# Patient Record
Sex: Female | Born: 1955 | Race: White | Hispanic: No | Marital: Married | Smoking: Never smoker
Health system: Southern US, Community
[De-identification: ages and names within clinical notes are randomized; demographics above are authoritative.]

## PROBLEM LIST (undated history)

## (undated) ENCOUNTER — Ambulatory Visit (HOSPITAL_COMMUNITY): Source: Home / Self Care

## (undated) DIAGNOSIS — Z8371 Family history of colonic polyps: Secondary | ICD-10-CM

## (undated) DIAGNOSIS — R339 Retention of urine, unspecified: Secondary | ICD-10-CM

## (undated) DIAGNOSIS — E119 Type 2 diabetes mellitus without complications: Secondary | ICD-10-CM

## (undated) DIAGNOSIS — G629 Polyneuropathy, unspecified: Secondary | ICD-10-CM

## (undated) DIAGNOSIS — M8580 Other specified disorders of bone density and structure, unspecified site: Secondary | ICD-10-CM

## (undated) DIAGNOSIS — N393 Stress incontinence (female) (male): Secondary | ICD-10-CM

## (undated) DIAGNOSIS — T7840XA Allergy, unspecified, initial encounter: Secondary | ICD-10-CM

## (undated) DIAGNOSIS — L409 Psoriasis, unspecified: Secondary | ICD-10-CM

## (undated) DIAGNOSIS — E782 Mixed hyperlipidemia: Secondary | ICD-10-CM

## (undated) DIAGNOSIS — E785 Hyperlipidemia, unspecified: Secondary | ICD-10-CM

## (undated) DIAGNOSIS — K219 Gastro-esophageal reflux disease without esophagitis: Secondary | ICD-10-CM

## (undated) DIAGNOSIS — R9389 Abnormal findings on diagnostic imaging of other specified body structures: Secondary | ICD-10-CM

## (undated) DIAGNOSIS — J302 Other seasonal allergic rhinitis: Secondary | ICD-10-CM

## (undated) DIAGNOSIS — M1990 Unspecified osteoarthritis, unspecified site: Secondary | ICD-10-CM

## (undated) DIAGNOSIS — N819 Female genital prolapse, unspecified: Secondary | ICD-10-CM

## (undated) DIAGNOSIS — G709 Myoneural disorder, unspecified: Secondary | ICD-10-CM

## (undated) DIAGNOSIS — N182 Chronic kidney disease, stage 2 (mild): Secondary | ICD-10-CM

## (undated) DIAGNOSIS — Z83719 Family history of colon polyps, unspecified: Secondary | ICD-10-CM

## (undated) DIAGNOSIS — Z973 Presence of spectacles and contact lenses: Secondary | ICD-10-CM

## (undated) HISTORY — DX: Family history of colon polyps, unspecified: Z83.719

## (undated) HISTORY — DX: Type 2 diabetes mellitus without complications: E11.9

## (undated) HISTORY — DX: Allergy, unspecified, initial encounter: T78.40XA

## (undated) HISTORY — DX: Gastro-esophageal reflux disease without esophagitis: K21.9

## (undated) HISTORY — DX: Hyperlipidemia, unspecified: E78.5

## (undated) HISTORY — PX: KNEE SURGERY: SHX244

## (undated) HISTORY — DX: Unspecified osteoarthritis, unspecified site: M19.90

## (undated) HISTORY — DX: Myoneural disorder, unspecified: G70.9

## (undated) HISTORY — DX: Family history of colonic polyps: Z83.71

---

## 2002-08-27 ENCOUNTER — Other Ambulatory Visit: Admission: RE | Admit: 2002-08-27 | Discharge: 2002-08-27 | Payer: Self-pay | Admitting: Family Medicine

## 2004-10-06 ENCOUNTER — Other Ambulatory Visit: Admission: RE | Admit: 2004-10-06 | Discharge: 2004-10-06 | Payer: Self-pay | Admitting: Family Medicine

## 2006-03-20 ENCOUNTER — Other Ambulatory Visit: Admission: RE | Admit: 2006-03-20 | Discharge: 2006-03-20 | Payer: Self-pay | Admitting: Family Medicine

## 2007-11-11 ENCOUNTER — Other Ambulatory Visit: Admission: RE | Admit: 2007-11-11 | Discharge: 2007-11-11 | Payer: Self-pay | Admitting: Family Medicine

## 2010-09-16 ENCOUNTER — Other Ambulatory Visit (HOSPITAL_COMMUNITY)
Admission: RE | Admit: 2010-09-16 | Discharge: 2010-09-16 | Disposition: A | Discharge status: Home / Self Care | Payer: Managed Care, Other (non HMO) | Source: Ambulatory Visit | Attending: Family Medicine | Admitting: Family Medicine

## 2010-09-16 ENCOUNTER — Other Ambulatory Visit: Payer: Self-pay | Admitting: Family Medicine

## 2010-09-16 DIAGNOSIS — Z124 Encounter for screening for malignant neoplasm of cervix: Secondary | ICD-10-CM | POA: Insufficient documentation

## 2013-09-18 ENCOUNTER — Other Ambulatory Visit: Payer: Self-pay | Admitting: Family Medicine

## 2013-09-18 ENCOUNTER — Other Ambulatory Visit (HOSPITAL_COMMUNITY)
Admission: RE | Admit: 2013-09-18 | Discharge: 2013-09-18 | Disposition: A | Discharge status: Home / Self Care | Payer: Managed Care, Other (non HMO) | Source: Ambulatory Visit | Attending: Family Medicine | Admitting: Family Medicine

## 2013-09-18 DIAGNOSIS — Z124 Encounter for screening for malignant neoplasm of cervix: Secondary | ICD-10-CM | POA: Insufficient documentation

## 2013-09-22 LAB — CYTOLOGY - PAP

## 2013-11-18 ENCOUNTER — Ambulatory Visit (INDEPENDENT_AMBULATORY_CARE_PROVIDER_SITE_OTHER): Payer: Managed Care, Other (non HMO) | Admitting: Sports Medicine

## 2013-11-18 ENCOUNTER — Encounter: Payer: Self-pay | Admitting: Sports Medicine

## 2013-11-18 ENCOUNTER — Ambulatory Visit: Payer: Managed Care, Other (non HMO) | Admitting: Sports Medicine

## 2013-11-18 VITALS — BP 135/86 | HR 90 | Ht 63.0 in | Wt 135.0 lb

## 2013-11-18 DIAGNOSIS — M25562 Pain in left knee: Secondary | ICD-10-CM

## 2013-11-18 DIAGNOSIS — M1711 Unilateral primary osteoarthritis, right knee: Secondary | ICD-10-CM | POA: Insufficient documentation

## 2013-11-18 NOTE — Patient Instructions (Addendum)
Work with a Pro so that you are hitting your back hand off your front (right) leg instead of your back leg.   Hit on your own with one leg backhands off the wall standing on your right leg.  - 10-15 minutes.   Three times a week, stationary bike. Rehab for the knee.   Try to perform the exercises that we gave you at least once a day.   Do the drills and exercises for three weeks. The fourth week you can start to play socially.   Follow up in 4 weeks.

## 2013-11-18 NOTE — Progress Notes (Signed)
   Subjective:    Patient ID: Susan Stafford, female    DOB: 12/19/1955, 58 y.o.   MRN: [id number]  HPI  Shayleigh Bjorkman is here for left knee pain.   Patient is an avid Firefighter. She was hitting a back hand a year ago and felt pain in her left knee. She had an MRI showing a posterior lateral partial meniscus tear. She was seen by an orthopedic surgeon that encouraged to continue to play and not going to surgery. She was playing about a month ago and was again hitting a backhand and the pain in her left knee became worse. She has not played any tennis since then.  The pain radiates down her posterior calf. It is worse with squatting. Her knee doesn't pop or lock. He knee feels like it might give way.  The pain has stayed the same since the injury about a month ago. She denies any prior surgery in her left knee. She denies any numbness or tingling in her lower extremities.   Review of Systems 11 point review system reviewed and negative other than stated in HPI     Objective:   Physical Exam BP 135/86  Pulse 90  Ht 5\' 3"  (1.6 m)  Wt 135 lb (61.236 kg)  BMI 23.92 kg/m2 General: NAD, well appearing, female  Knee Exam:  Laterality: left Appearance: no overlying effusion, erythema, or ecchymosis  Edema: no posterior, lateral  Tenderness: yes  lateral, meniscal joint line  Range of Motion: Passive Extension: normal Flexion:pain with full flexion Active Extension: normal Flexion: normal Laxity: none Maneuvers: Lachman's: negative  McMurray's: negative  Posterior Drawer: negative  Patellar Compression: negative  Thessaly test: negative  Strength:  Quadricep: 5/5 Hamstring: 5/5 Gait: normal Crepitus felt b/l knee.  HIP exam: moderately weak hip abduction b/l      Assessment & Plan:

## 2013-11-18 NOTE — Assessment & Plan Note (Signed)
Posterior lateral partial meniscal tear upon review of her MRI from one year ago. Most likely worsening of her tear while playing tennis. She wants to continue to play tennis and be active. No effusion today and ligaments intact.  - body helix provided  - given home modalities.   - b/l hip abduction strengthening exercises   - core plank exercises   - step up exercises  - working with a pro to hit back hand off right leg instead of left  - performing these technique changes and exercises for three weeks before playing again  - will play socially after three weeks - follow up in 4 weeks-->consider U/S if any effusion or no improvement.

## 2013-12-16 ENCOUNTER — Ambulatory Visit (INDEPENDENT_AMBULATORY_CARE_PROVIDER_SITE_OTHER): Payer: Managed Care, Other (non HMO) | Admitting: Sports Medicine

## 2013-12-16 ENCOUNTER — Encounter: Payer: Self-pay | Admitting: Sports Medicine

## 2013-12-16 VITALS — BP 138/85

## 2013-12-16 DIAGNOSIS — E119 Type 2 diabetes mellitus without complications: Secondary | ICD-10-CM

## 2013-12-16 DIAGNOSIS — M25562 Pain in left knee: Secondary | ICD-10-CM

## 2013-12-16 NOTE — Assessment & Plan Note (Signed)
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

## 2013-12-16 NOTE — Progress Notes (Signed)
Patient ID: Susan Stafford, female   DOB: [birth year], 58 y.o.   MRN: [id number]  This patient returns for followup of left knee pain She plays regular 2 months She works out regularly One year ago she had evaluation for her knee pain and was found to have a tear in the posterior horn of the lateral meniscus of the left knee  Saw her one month ago I gave her a compression sleeve which does help with walking and tennis I gave her home exercises and some to do with her trainer She states that the knee pain has not changed that much  In questioning we found that she is doing and some deeper squats and lunges which may be aggravating the knee  She has a history of type 2 diabetes Both parents have diabetes Her father died at age 26 with complications related to his heart probably from his diabetes  Physical examination No acute distress and she appears physically fit BP 138/85 mmHg  Left Knee: Normal to inspection with no erythema or effusion or obvious bony abnormalities. Palpation normal with no warmth or joint line tenderness or patellar tenderness or condyle tenderness. ROM normal in flexion and extension and lower leg rotation. Ligaments with solid consistent endpoints including ACL, PCL, LCL, MCL. Negative Mcmurray's and provocative meniscal tests. Non painful patellar compression. Patellar and quadriceps tendons unremarkable. Hamstring and quadriceps strength is normal.  However compared to the right knee the left knee does lack 5-10 of full flexion  She does have excellent hip abduction strength and hip flexion strength  MSK ultrasound Left knee shows a small suprapatellar pouch effusion that is not present on the right This extends approximately 3 cm above the patella Quadriceps and patellar tendons are normal Medial meniscus normal Lateral meniscus is normal with the exception of the posterior horn which shows a small split

## 2014-06-05 ENCOUNTER — Other Ambulatory Visit (HOSPITAL_COMMUNITY)
Admission: RE | Admit: 2014-06-05 | Discharge: 2014-06-05 | Disposition: A | Discharge status: Home / Self Care | Payer: Managed Care, Other (non HMO) | Source: Ambulatory Visit | Attending: Obstetrics and Gynecology | Admitting: Obstetrics and Gynecology

## 2014-06-05 ENCOUNTER — Other Ambulatory Visit: Payer: Self-pay | Admitting: Obstetrics and Gynecology

## 2014-06-05 DIAGNOSIS — Z1151 Encounter for screening for human papillomavirus (HPV): Secondary | ICD-10-CM | POA: Insufficient documentation

## 2014-06-08 LAB — CERVICOVAGINAL ANCILLARY ONLY: HPV: NOT DETECTED

## 2014-09-21 ENCOUNTER — Other Ambulatory Visit: Payer: Self-pay

## 2014-09-21 DIAGNOSIS — Z1231 Encounter for screening mammogram for malignant neoplasm of breast: Secondary | ICD-10-CM

## 2014-10-05 ENCOUNTER — Ambulatory Visit
Admission: RE | Admit: 2014-10-05 | Discharge: 2014-10-05 | Disposition: A | Discharge status: Home / Self Care | Payer: Managed Care, Other (non HMO) | Source: Ambulatory Visit

## 2014-10-05 DIAGNOSIS — Z1231 Encounter for screening mammogram for malignant neoplasm of breast: Secondary | ICD-10-CM

## 2015-01-28 ENCOUNTER — Ambulatory Visit
Admission: RE | Admit: 2015-01-28 | Discharge: 2015-01-28 | Disposition: A | Discharge status: Home / Self Care | Payer: Managed Care, Other (non HMO) | Source: Ambulatory Visit | Attending: Family Medicine | Admitting: Family Medicine

## 2015-01-28 ENCOUNTER — Other Ambulatory Visit: Payer: Self-pay | Admitting: Family Medicine

## 2015-01-28 DIAGNOSIS — M79601 Pain in right arm: Secondary | ICD-10-CM

## 2015-01-28 DIAGNOSIS — M79602 Pain in left arm: Principal | ICD-10-CM

## 2015-01-28 DIAGNOSIS — M542 Cervicalgia: Secondary | ICD-10-CM

## 2015-03-11 ENCOUNTER — Ambulatory Visit (INDEPENDENT_AMBULATORY_CARE_PROVIDER_SITE_OTHER): Payer: Managed Care, Other (non HMO) | Admitting: Neurology

## 2015-03-11 ENCOUNTER — Ambulatory Visit (INDEPENDENT_AMBULATORY_CARE_PROVIDER_SITE_OTHER): Payer: Self-pay | Admitting: Neurology

## 2015-03-11 DIAGNOSIS — Z0289 Encounter for other administrative examinations: Secondary | ICD-10-CM

## 2015-03-11 DIAGNOSIS — R252 Cramp and spasm: Secondary | ICD-10-CM | POA: Diagnosis not present

## 2015-03-11 NOTE — Procedures (Signed)
  [facility]    Provider:  Dr Jaynee Eagles Referring Provider: Kelton Pillar, MD Primary Care Physician:  Osborne Casco, MD  History:  Susan Stafford is a 60 y.o. female here as a referral from Dr. Laurann Montana for spasms in her upper extremities for several months. Denies pain, paresthesias or numbness.  Summary  Nerve conduction studies were performed on the bilateral upper extremities:  The bilateral Median motor nerves showed normal conductions with normal F Wave latencies The bilateral Ulnar motor nerves showed normal conductions with normal F Wave latencies The bilateral second-digit Median sensory nerves were within normal limits The bilateral fifth-digit Ulnar sensory nerves were within normal limits  EMG Needle study was performed on selected right upper extremity muscles:   The right Deltoid, right Triceps, right Pronator Teres, right Opponens Pollicis, right First Dorsal interosseous muscles were within normal limits.   Conclusion: This is a normal study. No electrophysiologic evidence for ulnar or median neuropathy, peripheral polyneuropathy, cervical radiculopathy or muscle disorder.   Sarina Ill, MD  [facility] [address] [place], [place] [address]  Phone [phone] Fax [phone]

## 2015-03-11 NOTE — Progress Notes (Signed)
  [facility]    Provider:  Dr Jaynee Eagles Referring Provider: Kelton Pillar, MD Primary Care Physician:  Osborne Casco, MD  History:  Susan Stafford is a 60 y.o. female here as a referral from Dr. Laurann Montana for spasms in her upper extremities for several months. Denies pain, paresthesias or numbness.  Summary  Nerve conduction studies were performed on the bilateral upper extremities:  The bilateral Median motor nerves showed normal conductions with normal F Wave latencies The bilateral Ulnar motor nerves showed normal conductions with normal F Wave latencies The bilateral second-digit Median sensory nerves were within normal limits The bilateral fifth-digit Ulnar sensory nerves were within normal limits  EMG Needle study was performed on selected right upper extremity muscles:   The right Deltoid, right Triceps, right Pronator Teres, right Opponens Pollicis, right First Dorsal interosseous muscles were within normal limits.   Conclusion: This is a normal study. No electrophysiologic evidence for ulnar or median neuropathy, peripheral polyneuropathy, cervical radiculopathy or muscle disorder.   Sarina Ill, MD  [facility] [address] [place], [place] [address]  Phone [phone] Fax [phone]

## 2015-03-14 NOTE — Progress Notes (Signed)
See procedure note.

## 2015-05-31 ENCOUNTER — Other Ambulatory Visit: Payer: Self-pay | Admitting: Obstetrics and Gynecology

## 2015-05-31 ENCOUNTER — Other Ambulatory Visit (HOSPITAL_COMMUNITY)
Admission: RE | Admit: 2015-05-31 | Discharge: 2015-05-31 | Disposition: A | Discharge status: Home / Self Care | Payer: Managed Care, Other (non HMO) | Source: Ambulatory Visit | Attending: Obstetrics and Gynecology | Admitting: Obstetrics and Gynecology

## 2015-05-31 DIAGNOSIS — Z01419 Encounter for gynecological examination (general) (routine) without abnormal findings: Secondary | ICD-10-CM | POA: Insufficient documentation

## 2015-06-01 LAB — CYTOLOGY - PAP

## 2015-09-09 ENCOUNTER — Other Ambulatory Visit: Payer: Self-pay | Admitting: Family Medicine

## 2015-09-09 DIAGNOSIS — Z1231 Encounter for screening mammogram for malignant neoplasm of breast: Secondary | ICD-10-CM

## 2015-10-06 ENCOUNTER — Ambulatory Visit
Admission: RE | Admit: 2015-10-06 | Discharge: 2015-10-06 | Disposition: A | Discharge status: Home / Self Care | Payer: Managed Care, Other (non HMO) | Source: Ambulatory Visit | Attending: Family Medicine | Admitting: Family Medicine

## 2015-10-06 DIAGNOSIS — Z1231 Encounter for screening mammogram for malignant neoplasm of breast: Secondary | ICD-10-CM

## 2015-10-07 ENCOUNTER — Other Ambulatory Visit: Payer: Self-pay | Admitting: Family Medicine

## 2015-10-07 DIAGNOSIS — M5430 Sciatica, unspecified side: Secondary | ICD-10-CM

## 2015-10-15 ENCOUNTER — Other Ambulatory Visit: Payer: Self-pay | Admitting: Family Medicine

## 2015-10-15 DIAGNOSIS — M5430 Sciatica, unspecified side: Secondary | ICD-10-CM

## 2015-10-23 ENCOUNTER — Ambulatory Visit
Admission: RE | Admit: 2015-10-23 | Discharge: 2015-10-23 | Disposition: A | Discharge status: Home / Self Care | Payer: Managed Care, Other (non HMO) | Source: Ambulatory Visit | Attending: Family Medicine | Admitting: Family Medicine

## 2015-10-23 ENCOUNTER — Other Ambulatory Visit: Payer: Managed Care, Other (non HMO)

## 2015-10-23 DIAGNOSIS — M5430 Sciatica, unspecified side: Secondary | ICD-10-CM

## 2018-01-31 HISTORY — PX: KNEE ARTHROSCOPY: SUR90

## 2018-07-23 HISTORY — PX: KNEE ARTHROSCOPY W/ MENISCAL REPAIR: SHX1877

## 2018-12-03 ENCOUNTER — Other Ambulatory Visit: Payer: Self-pay

## 2018-12-03 ENCOUNTER — Encounter: Payer: Self-pay | Admitting: Sports Medicine

## 2018-12-03 ENCOUNTER — Ambulatory Visit (INDEPENDENT_AMBULATORY_CARE_PROVIDER_SITE_OTHER): Payer: 59 | Admitting: Sports Medicine

## 2018-12-03 VITALS — BP 112/86 | Ht 63.0 in | Wt 130.0 lb

## 2018-12-03 DIAGNOSIS — M25561 Pain in right knee: Secondary | ICD-10-CM | POA: Diagnosis not present

## 2018-12-03 MED ORDER — METHYLPREDNISOLONE ACETATE 40 MG/ML IJ SUSP
40.0000 mg | Freq: Once | INTRAMUSCULAR | Status: AC
  Administered 2018-12-03: 40 mg via INTRA_ARTICULAR

## 2018-12-03 NOTE — Progress Notes (Signed)
PCP: Kelton Pillar, MD  Subjective:   HPI: Patient is a 63 y.o. female here for evaluation of right knee pain.  Patient notes her knee pain is been present for the last several weeks.  In June she had a arthroscopy for partial meniscectomy of her right knee.  She notes her pain improved completely after the surgery however several weeks ago it returned.  The pain is located on the medial aspect of her knee.  It does not radiate.  She denies any locking or catching of her knee but does note some instability at times.  Patient does endorse swelling of her knee but she has no bruising.  She has not taken anything for her pain other than over-the-counter anti-inflammatories which has not helped improve her pain.  She notes she did not do any sort of rehab after her surgery.  Patient notes the pain has a sharp stabbing quality to it.  Pain is very limiting and keeps her up at night. She would like CSI.                                                                                    Review of Systems: See HPI above.  History reviewed. No pertinent past medical history.  Current Outpatient Medications on File Prior to Visit  Medication Sig Dispense Refill  . aspirin 81 MG tablet Take 81 mg by mouth daily.    . Dulaglutide (TRULICITY) 1.5 0000000 SOPN Inject 1.5 mg into the skin.    . Empagliflozin-metFORMIN HCl ER (SYNJARDY XR) 12.06-998 MG TB24 Synjardy XR 12.5 mg-1,000 mg tablet, extended release    . Multiple Vitamin (MULTIVITAMIN) capsule Take 1 capsule by mouth daily.    Marland Kitchen estrogen, conjugated,-medroxyprogesterone (PREMPRO) 0.45-1.5 MG per tablet Take 1 tablet by mouth daily.    . fluvastatin (LESCOL) 40 MG capsule Take 40 mg by mouth at bedtime.    Marland Kitchen linagliptin (TRADJENTA) 5 MG TABS tablet Take 5 mg by mouth daily.    . meloxicam (MOBIC) 7.5 MG tablet Take 7.5 mg by mouth daily.    . metFORMIN (GLUCOPHAGE) 500 MG tablet Take by mouth 2 (two) times daily with a meal.     No current  facility-administered medications on file prior to visit.     History reviewed. No pertinent surgical history.  No Known Allergies  Social History   Socioeconomic History  . Marital status: Married    Spouse name: Not on file  . Number of children: Not on file  . Years of education: Not on file  . Highest education level: Not on file  Occupational History  . Not on file  Social Needs  . Financial resource strain: Not on file  . Food insecurity    Worry: Not on file    Inability: Not on file  . Transportation needs    Medical: Not on file    Non-medical: Not on file  Tobacco Use  . Smoking status: Never Smoker  Substance and Sexual Activity  . Alcohol use: Not on file  . Drug use: Not on file  . Sexual activity: Not on file  Lifestyle  . Physical activity  Days per week: Not on file    Minutes per session: Not on file  . Stress: Not on file  Relationships  . Social Herbalist on phone: Not on file    Gets together: Not on file    Attends religious service: Not on file    Active member of club or organization: Not on file    Attends meetings of clubs or organizations: Not on file    Relationship status: Not on file  . Intimate partner violence    Fear of current or ex partner: Not on file    Emotionally abused: Not on file    Physically abused: Not on file    Forced sexual activity: Not on file  Other Topics Concern  . Not on file  Social History Narrative  . Not on file    History reviewed. No pertinent family history.      Objective:  Physical Exam: BP 112/86   Ht 5\' 3"  (1.6 m)   Wt 130 lb (59 kg)   BMI 23.03 kg/m  Gen: NAD, comfortable in exam room Lungs: Breathing comfortably on room air Knee Exam Right -Inspection: Diffuse swelling noted of the right knee.  There is no discoloration, no deformity -Palpation: Tenderness palpation at the right medial joint line. -ROM: Extension: 0 degrees; Flexion: 140 degrees -Strength: Extension:  5/5 however atrophy of the VMO was seen while testing strength knee extension; Flexion: 5/5.  Patient with 4 out of 5 strength with hip abduction. -Special Tests: Varus Stress: Negative; Valgus Stress: Negative; Lachman: Negative; Posterior drawer: Negative; McMurray: Negative -Limb neurovascularly intact, no instability noted  Contralateral Knee -Inspection: no deformity, no discoloration -Palpation: medial joint line: Non-tender; lateral joint line: non-tender; quad tendon: non-tender; patella: non-tender; patellar tendon: non-tendon -ROM: Extension: 0 degrees; Flexion: 140 degrees -Strength: Extension: 5/5 however atrophy of the VMO was noted while testing strength of knee extension; Flexion: 5/5.  4 out of 5 strength with hip abduction -Limb neurovascularly intact, no instability noted   Limited diagnostic ultrasound of the right knee Findings: -Patient with a large joint effusion noted in the suprapatellar pouch is seen in both long and short axis -Normal appearance of the quadriceps tendon -Normal appearance of the patellar tendon -Overall normal appearance of the medial meniscus however there was a small section of meniscus that was removed from her surgery. -Normal appearance of the lateral meniscus Impression: -Postoperative findings from a partial meniscectomy as well as a large joint effusion  Ultrasound was done with Dr. Oneida Alar Agree with findings.  Ila Mcgill, MD   Assessment & Plan:  Patient is a 64 y.o. female here for evaluation of right knee pain  1.  Right knee pain -Patient with large joint effusion of her right knee and history of knee arthroscopy for partial meniscectomy and June -Patient with significant weakness noted in the hips seen on physical exam -Patient given home exercises to work on including hip abduction, hip external rotation, sidestep, crossover step up, knee extensions while holding a ball between the legs -Patient was fitted for knee compression  sleeve  -Consent was obtained for aspiration and injection of patient's right knee.  The skin was cleaned and prepped using alcohol.  Ultrasound guidance was used to aspirate her right knee draining approximately 10 cc of clear straw-colored fluid.  Next 40 mg of Depo-Medrol and 3 cc of lidocaine were injected into her knee.  Patient tolerated the injection well there were no complications.  Patient will  follow-up in 4 to 6 weeks  I observed and examined the patient with Dr. Sheppard Coil and agree with assessment and plan.  Note reviewed and modified by me. Ila Mcgill, MD

## 2018-12-03 NOTE — Patient Instructions (Signed)
For your knee pain I would like you to work on the following exercises  1.  Hip abduction 2.  Hip external rotation 3.  Sidestep 4.  Crossover step up 5.  Knee extensions while holding a ball between the legs  The injection you received will take up to a week to work.  Do not be alarmed if you do not have pain relief within the next few days.  Tonight you may ice your knee if you have pain.  We will fit you for a knee compression sleeve for the right leg  We will see you back in 4 to 6 weeks

## 2018-12-18 ENCOUNTER — Other Ambulatory Visit: Payer: Self-pay | Admitting: Family Medicine

## 2018-12-18 DIAGNOSIS — Z1231 Encounter for screening mammogram for malignant neoplasm of breast: Secondary | ICD-10-CM

## 2018-12-19 ENCOUNTER — Ambulatory Visit
Admission: RE | Admit: 2018-12-19 | Discharge: 2018-12-19 | Disposition: A | Discharge status: Home / Self Care | Payer: 59 | Source: Ambulatory Visit | Attending: Family Medicine | Admitting: Family Medicine

## 2018-12-19 ENCOUNTER — Other Ambulatory Visit: Payer: Self-pay

## 2018-12-19 DIAGNOSIS — Z1231 Encounter for screening mammogram for malignant neoplasm of breast: Secondary | ICD-10-CM

## 2018-12-31 ENCOUNTER — Encounter: Payer: Self-pay | Admitting: Sports Medicine

## 2018-12-31 ENCOUNTER — Other Ambulatory Visit: Payer: Self-pay

## 2018-12-31 ENCOUNTER — Ambulatory Visit (INDEPENDENT_AMBULATORY_CARE_PROVIDER_SITE_OTHER): Payer: 59 | Admitting: Sports Medicine

## 2018-12-31 VITALS — BP 112/78 | Ht 63.0 in | Wt 132.0 lb

## 2018-12-31 DIAGNOSIS — M25561 Pain in right knee: Secondary | ICD-10-CM | POA: Diagnosis not present

## 2018-12-31 NOTE — Progress Notes (Signed)
[facility] [address] [place], [place] [address] Phone: [phone] Fax: [phone]   Patient Name: Susan Stafford Date of Birth: 1955/12/08 Medical Record Number: [id number] Gender: female Date of Encounter: 12/31/2018  CC: Right knee pain  HPI: Susan Stafford is following up for right knee pain.  As a reminder she had a scope in June of this year, but has dealt with right knee pain and swelling over the last 6-8 weeks.  She was last seen 1 month ago at which time she had an aspiration and CS injection.  Unfortunately she only had a couple of days of relief, and the pain and swelling returned.  It is aggravated with walking, standing, and rising from a seated position.  She takes Aleve as needed.  She was using her compression sleeve, but felt too uncomfortable.  Still not able to play tennis or ride a bicycle without being in pain.  No past medical history on file.  Current Outpatient Medications on File Prior to Visit  Medication Sig Dispense Refill  . Dulaglutide (TRULICITY) 1.5 0000000 SOPN Inject 1.5 mg into the skin.    . Empagliflozin-metFORMIN HCl ER (SYNJARDY XR) 12.06-998 MG TB24 Synjardy XR 12.5 mg-1,000 mg tablet, extended release    . fluvastatin (LESCOL) 40 MG capsule Take 40 mg by mouth at bedtime.    . Multiple Vitamin (MULTIVITAMIN) capsule Take 1 capsule by mouth daily.    Marland Kitchen aspirin 81 MG tablet Take 81 mg by mouth daily.    Marland Kitchen estrogen, conjugated,-medroxyprogesterone (PREMPRO) 0.45-1.5 MG per tablet Take 1 tablet by mouth daily.    Marland Kitchen linagliptin (TRADJENTA) 5 MG TABS tablet Take 5 mg by mouth daily.    . meloxicam (MOBIC) 7.5 MG tablet Take 7.5 mg by mouth daily.    . metFORMIN (GLUCOPHAGE) 500 MG tablet Take by mouth 2 (two) times daily with a meal.     No current facility-administered medications on file prior to visit.     No past surgical history on file.  Arthroscopic surgery by Dr. Theda Sers 06/2018 RT Knee medial  meniscus  No Known Allergies  Social History   Socioeconomic History  . Marital status: Married    Spouse name: Not on file  . Number of children: Not on file  . Years of education: Not on file  . Highest education level: Not on file  Occupational History  . Not on file  Social Needs  . Financial resource strain: Not on file  . Food insecurity    Worry: Not on file    Inability: Not on file  . Transportation needs    Medical: Not on file    Non-medical: Not on file  Tobacco Use  . Smoking status: Never Smoker  Substance and Sexual Activity  . Alcohol use: Not on file  . Drug use: Not on file  . Sexual activity: Not on file  Lifestyle  . Physical activity    Days per week: Not on file    Minutes per session: Not on file  . Stress: Not on file  Relationships  . Social Herbalist on phone: Not on file    Gets together: Not on file    Attends religious service: Not on file    Active member of club or organization: Not on file    Attends meetings of clubs or organizations: Not on file    Relationship status: Not on file  . Intimate partner violence  Fear of current or ex partner: Not on file    Emotionally abused: Not on file    Physically abused: Not on file    Forced sexual activity: Not on file  Other Topics Concern  . Not on file  Social History Narrative  . Not on file    No family history on file.  BP 112/78   Ht 5\' 3"  (1.6 m)   Wt 132 lb (59.9 kg)   BMI 23.38 kg/m   ROS:  See HPI CONST: no F/C, no malaise, no fatigue MSK: See above NEURO: no numbness/tingling, no weakness SKIN: no rash, no lesions HEME: no bleeding, no bruising, no erythema  Objective: GEN: Alert and oriented, NAD Pulm: Breathing unlabored PSY: normal mood, congruent affect  Right knee: Moderate prepatellar and suprapatellar effusion. Mild TTP at medial joint line Lacking terminal knee extension Ligaments with solid consistent endpoints including ACL, PCL,  LCL, MCL. Weakly positive Thessaly Non painful patellar compression. Patellar glide without crepitus. Patellar and quadriceps tendons unremarkable. Hamstring and quadriceps strength is normal.  Neurovascularly intact.  She had XR of her right knee at the orthopedic clinic which demonstrated moderate- osteoarthritis. Has also had XR and assessment showing Left knee OA   Assessment and Plan:  1.  Recalcitrant right knee pain  There is concern for a possible OCD lesion giving the recurrence of swelling and pain being the primary symptom. She does have some known OA.  Meniscus surgery did seem to help in the first 3 months.  We will move forward with an MRI of the right knee to further evaluate intra-articular damage.  We will bring her back to clinic to review images and discuss treatment plan.  In the interim recommended wearing the compression sleeve in spurts.  Continue ice and anti-inflammatory.  Continue HEP.   Lanier Clam, DO, ATC Sports Medicine Fellow  I observed and examined the patient with the Dr. Kathrynn Speed and agree with assessment and plan.  Note reviewed and modified by me. Will go over MRI with patient and try to decide if more surgical intervention needed. Ila Mcgill, MD

## 2019-01-23 ENCOUNTER — Other Ambulatory Visit: Payer: Self-pay

## 2019-01-23 ENCOUNTER — Ambulatory Visit
Admission: RE | Admit: 2019-01-23 | Discharge: 2019-01-23 | Disposition: A | Discharge status: Home / Self Care | Payer: 59 | Source: Ambulatory Visit | Attending: Sports Medicine | Admitting: Sports Medicine

## 2019-01-23 DIAGNOSIS — M25561 Pain in right knee: Secondary | ICD-10-CM

## 2019-02-04 ENCOUNTER — Other Ambulatory Visit (HOSPITAL_COMMUNITY)
Admission: RE | Admit: 2019-02-04 | Discharge: 2019-02-04 | Disposition: A | Discharge status: Home / Self Care | Payer: BC Managed Care – PPO | Source: Ambulatory Visit | Attending: Family Medicine | Admitting: Family Medicine

## 2019-02-04 ENCOUNTER — Other Ambulatory Visit: Payer: Self-pay | Admitting: Family Medicine

## 2019-02-04 DIAGNOSIS — Z124 Encounter for screening for malignant neoplasm of cervix: Secondary | ICD-10-CM | POA: Diagnosis present

## 2019-02-11 LAB — CYTOLOGY - PAP
Comment: NEGATIVE
Diagnosis: NEGATIVE
High risk HPV: NEGATIVE

## 2019-08-19 ENCOUNTER — Other Ambulatory Visit: Payer: Self-pay

## 2019-08-19 ENCOUNTER — Telehealth (INDEPENDENT_AMBULATORY_CARE_PROVIDER_SITE_OTHER): Payer: BC Managed Care – PPO | Admitting: Sports Medicine

## 2019-08-19 DIAGNOSIS — M1711 Unilateral primary osteoarthritis, right knee: Secondary | ICD-10-CM | POA: Diagnosis not present

## 2019-08-19 MED ORDER — TRAMADOL HCL 50 MG PO TABS: 50.0000 mg | ORAL_TABLET | Freq: Four times a day (QID) | ORAL | 0 refills | Status: DC | PRN

## 2019-08-19 NOTE — Assessment & Plan Note (Signed)
Hx of mild psoriasis Unsure if this is a cause for accelerated OA of knees Active in walking and tennis before  Her symptoms are quite limiting now I believe she needs an opinion regarding TKR D/W patient and she would like to see Dr. Maureen Ralphs Referral for TKR opinion made today

## 2019-08-19 NOTE — Progress Notes (Addendum)
Patient agrees to a video visit today.  She understands limitations of exam Patient at home Physician in [facility] office  Chief complaint right knee pain  Patient was evaluated last fall in October for acute pain of her right knee She had arthroscopic surgery by Dr. Theda Sers and did get some relief However by October pain had gotten severe again and and stopped her from playing tennis She has documented arthritis in the right and left knee The right knee was the key symptomatic 1 recently  When seen in October by she had a pretty significant knee effusion Aspiration of fluid and corticosteroid injection gave her some brief relief She cut down her walking She takes 2 Aleve most days but is concerned with this because she is diabetic She tried tennis one time but felt that the knee was quite a bit worse after playing and decided not to continue  Today our discussion centers around treatment options  MRI on January 24, 2019 showed degenerative meniscal changes 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 Physician in [facility] office  Ila Mcgill, MD 07/13/20

## 2019-09-04 ENCOUNTER — Other Ambulatory Visit: Payer: Self-pay | Admitting: *Deleted

## 2019-09-04 MED ORDER — TRAMADOL HCL 50 MG PO TABS: 50.0000 mg | ORAL_TABLET | Freq: Four times a day (QID) | ORAL | 0 refills | Status: DC | PRN

## 2019-11-17 ENCOUNTER — Other Ambulatory Visit: Payer: Self-pay | Admitting: Family Medicine

## 2019-11-17 DIAGNOSIS — Z1231 Encounter for screening mammogram for malignant neoplasm of breast: Secondary | ICD-10-CM

## 2019-12-22 ENCOUNTER — Ambulatory Visit
Admission: RE | Admit: 2019-12-22 | Discharge: 2019-12-22 | Disposition: A | Discharge status: Home / Self Care | Payer: 59 | Source: Ambulatory Visit | Attending: Family Medicine | Admitting: Family Medicine

## 2019-12-22 ENCOUNTER — Other Ambulatory Visit: Payer: Self-pay

## 2019-12-22 DIAGNOSIS — Z1231 Encounter for screening mammogram for malignant neoplasm of breast: Secondary | ICD-10-CM

## 2020-07-07 ENCOUNTER — Other Ambulatory Visit: Payer: Self-pay | Admitting: Sports Medicine

## 2020-07-12 ENCOUNTER — Other Ambulatory Visit: Payer: Self-pay | Admitting: Sports Medicine

## 2020-07-15 ENCOUNTER — Other Ambulatory Visit: Payer: Self-pay

## 2020-07-15 ENCOUNTER — Ambulatory Visit: Payer: BC Managed Care – PPO | Admitting: Sports Medicine

## 2020-07-15 VITALS — BP 118/74 | Ht 63.0 in | Wt 129.0 lb

## 2020-07-15 DIAGNOSIS — M1711 Unilateral primary osteoarthritis, right knee: Secondary | ICD-10-CM

## 2020-07-15 MED ORDER — TRAMADOL HCL 50 MG PO TABS: 50.0000 mg | ORAL_TABLET | Freq: Four times a day (QID) | ORAL | 0 refills | Status: DC | PRN

## 2020-07-15 NOTE — Progress Notes (Signed)
Office Visit Note   Patient: Susan Stafford           Date of Birth: 03/24/1955           MRN: [id number] Visit Date: 07/15/2020 Requested by: Kelton Pillar, MD [address] [place],  [place] [address] PCP: Kelton Pillar, MD  Subjective: CC: Follow-up right knee pain  HPI: 65 year old female with past medical history of significant right knee arthritis presenting to clinic for the same.  Patient states her right knee pain has been largely controlled with exercises, as well as daily Aleve.  She also states she will take tramadol when her pain is severe.  She has been participating in water aerobics as well as quadriceps weights in the gym, which she feels offer significant improvement with her knee stability.  She also wears a knee sleeve with walking and says that this has been very helpful.  Patient is here today because she is leaving on Monday for a long vacation to [place].  She is anticipating doing an ample amount of walking and hiking during this trip, and wants to know if there is anything she can do to help make her pain more bearable.  She would like a refill of tramadol if possible. She has previously spoken with a knee replacement surgeon, who agreed that this would be a viable option for her.  She is getting Medicare insurance this fall, and would like to wait until she has a secondary insurance to help cover the cost of replacement.  Ideally, she would like to wait until the start of the new year so she will have more time for convalescence. She is otherwise doing well, with no additional questions or concerns today.              Objective: Vital Signs: BP 118/74   Ht 5\' 3"  (1.6 m)   Wt 129 lb (58.5 kg)   BMI 22.85 kg/m  No flowsheet data found.   No flowsheet data found.  Physical Exam:  General:  Alert and oriented, in no acute distress. Pulm:  Breathing unlabored. Psy:  Normal mood, congruent affect. Skin: Right knee  without bruises, rashes, or erythema. Overlying skin intact.  Right knee exam General: Normal gait  Moderate patellar crepitus bilaterally with knee flexion and extension.  Knee range of motion is preserved bilaterally.  Palpation: there is tenderness palpation over the medial joint line on the right.  No significant tenderness over lateral joint line or with patellar compression.  Supine exam: Small effusion, normal patellar mobility.   Ligamentous Exam:  No pain or laxity with anterior/posterior drawer.  No obvious Sag.  No pain though does have a subtle pseudolaxity with valgus stress across the knee.  Meniscus:  McMurray with no pain or deep clicking.   Strength: Hip flexion (L1), Hip Aduction (L2), Knee Extension (L3) are 5/5 Bilaterally Foot Inversion (L4), Dorsiflexion (L5), and Eversion (S1) 5/5 Bilaterally  Sensation: Intact to light touch medial and lateral aspects of lower extremities, and lateral, dorsal, and medial aspects of foot.    Imaging: None today.  Assessment & Plan: 65 year old female with past medical history of significant knee osteoarthritis who is presenting to clinic for the same.  Symptoms well controlled with daily Aleve as well as tramadol.  -We will refill her tramadol for her upcoming trip.  Encouraged to take this sparingly as needed for severe pain. -Continue with low-dose Aleve, safe NSAID use discussed.  Will avoid escalating to higher NSAIDs due to her type 2 diabetes. -Also due to her diabetes, will hold on steroid injections today. -Continue knee sleeve wear as well as knee exercises to help strengthen and stabilize the knee. -Patient expresses understanding with plan.  She no further questions or concerns today.     Procedures: No procedures performed     I observed and examined the patient with the SMresident and agree with assessment and plan.  Note reviewed and modified by me. Ila Mcgill, MD

## 2020-11-23 ENCOUNTER — Other Ambulatory Visit: Payer: Self-pay | Admitting: Family Medicine

## 2020-11-23 DIAGNOSIS — Z1231 Encounter for screening mammogram for malignant neoplasm of breast: Secondary | ICD-10-CM

## 2020-12-22 ENCOUNTER — Ambulatory Visit
Admission: RE | Admit: 2020-12-22 | Discharge: 2020-12-22 | Disposition: A | Discharge status: Home / Self Care | Payer: Medicare PPO | Source: Ambulatory Visit | Attending: Family Medicine | Admitting: Family Medicine

## 2020-12-22 ENCOUNTER — Other Ambulatory Visit: Payer: Self-pay

## 2020-12-22 DIAGNOSIS — Z1231 Encounter for screening mammogram for malignant neoplasm of breast: Secondary | ICD-10-CM

## 2021-03-09 ENCOUNTER — Other Ambulatory Visit: Payer: Self-pay | Admitting: Family Medicine

## 2021-03-09 DIAGNOSIS — E2839 Other primary ovarian failure: Secondary | ICD-10-CM

## 2021-03-24 ENCOUNTER — Encounter: Payer: Self-pay | Admitting: Endocrinology

## 2021-04-01 ENCOUNTER — Encounter: Payer: Self-pay | Admitting: Gastroenterology

## 2021-04-29 ENCOUNTER — Other Ambulatory Visit: Payer: Self-pay

## 2021-04-29 ENCOUNTER — Ambulatory Visit (AMBULATORY_SURGERY_CENTER): Payer: Medicare PPO | Admitting: *Deleted

## 2021-04-29 VITALS — Ht 63.0 in | Wt 121.0 lb

## 2021-04-29 DIAGNOSIS — Z1211 Encounter for screening for malignant neoplasm of colon: Secondary | ICD-10-CM

## 2021-04-29 NOTE — Progress Notes (Signed)
No egg or soy allergy known to patient  ?No issues known to pt with past sedation with any surgeries or procedures ?Patient denies ever being told they had issues or difficulty with intubation  ?No FH of Malignant Hyperthermia ?Pt is not on diet pills ?Pt is not on  home 02  ?Pt is not on blood thinners  ?Pt denies issues with constipation  ?No A fib or A flutter ? ?Pt is fully vaccinated  for Covid  ? ?Due to the COVID-19 pandemic we are asking patients to follow certain guidelines in PV and the [place]   ?Pt aware of COVID protocols and [facility] guidelines  ? ?PV completed over the phone. Pt verified name, DOB, address and insurance during PV today.  ?Pt emailed instruction packet to [email or web address] ?Pt encouraged to call with questions or issues.  ?If pt has My chart, procedure instructions sent via My Chart  ?Pt states no previous colonoscopy but has had 2 negative cologuard's in the past  ?

## 2021-05-11 ENCOUNTER — Encounter: Payer: Self-pay | Admitting: Gastroenterology

## 2021-05-11 ENCOUNTER — Telehealth: Payer: Self-pay | Admitting: Gastroenterology

## 2021-05-11 DIAGNOSIS — R3915 Urgency of urination: Secondary | ICD-10-CM | POA: Diagnosis not present

## 2021-05-11 NOTE — Telephone Encounter (Signed)
Patient's colon is scheduled for 3/31.  She just left her PCP's office and has a UTI and was prescribed Cipro.  She is questioning whether or not she should proceed with her colonoscopy.  Please call patient and advise.  Thank you. ?

## 2021-05-11 NOTE — Telephone Encounter (Signed)
Spoke with patient. Informed patient that she may still have the colonoscopy being on cipro as long as she does not have a fever when she comes in. Pt denies any fever. Pt verbalizes understanding to proceed with procedure as scheduled.  ?

## 2021-05-13 ENCOUNTER — Encounter: Payer: Self-pay | Admitting: Gastroenterology

## 2021-05-13 ENCOUNTER — Ambulatory Visit (AMBULATORY_SURGERY_CENTER): Payer: Medicare PPO | Admitting: Gastroenterology

## 2021-05-13 VITALS — BP 116/78 | HR 78 | Temp 97.1°F | Resp 12 | Ht 63.0 in | Wt 129.0 lb

## 2021-05-13 DIAGNOSIS — Z8371 Family history of colonic polyps: Secondary | ICD-10-CM

## 2021-05-13 DIAGNOSIS — D124 Benign neoplasm of descending colon: Secondary | ICD-10-CM | POA: Diagnosis not present

## 2021-05-13 DIAGNOSIS — Z1211 Encounter for screening for malignant neoplasm of colon: Secondary | ICD-10-CM

## 2021-05-13 DIAGNOSIS — K635 Polyp of colon: Secondary | ICD-10-CM

## 2021-05-13 DIAGNOSIS — D122 Benign neoplasm of ascending colon: Secondary | ICD-10-CM

## 2021-05-13 HISTORY — PX: COLONOSCOPY WITH PROPOFOL: SHX5780

## 2021-05-13 MED ORDER — SODIUM CHLORIDE 0.9 % IV SOLN: 500.0000 mL | Freq: Once | INTRAVENOUS | Status: DC

## 2021-05-13 NOTE — Progress Notes (Signed)
Sedate, gd SR, tolerated procedure well, VSS, report to RN 

## 2021-05-13 NOTE — Patient Instructions (Signed)
Handouts given for High Fiber Diet, Diverticulosis and polyps.  Continue present medications.  Await pathology results.  ? ?YOU HAD AN ENDOSCOPIC PROCEDURE TODAY AT [facility]:   Refer to the procedure report that was given to you for any specific questions about what was found during the examination.  If the procedure report does not answer your questions, please call your gastroenterologist to clarify.  If you requested that your care partner not be given the details of your procedure findings, then the procedure report has been included in a sealed envelope for you to review at your convenience later. ? ?YOU SHOULD EXPECT: Some feelings of bloating in the abdomen. Passage of more gas than usual.  Walking can help get rid of the air that was put into your GI tract during the procedure and reduce the bloating. If you had a lower endoscopy (such as a colonoscopy or flexible sigmoidoscopy) you may notice spotting of blood in your stool or on the toilet paper. If you underwent a bowel prep for your procedure, you may not have a normal bowel movement for a few days. ? ?Please Note:  You might notice some irritation and congestion in your nose or some drainage.  This is from the oxygen used during your procedure.  There is no need for concern and it should clear up in a day or so. ? ?SYMPTOMS TO REPORT IMMEDIATELY: ? ?Following lower endoscopy (colonoscopy or flexible sigmoidoscopy): ? Excessive amounts of blood in the stool ? Significant tenderness or worsening of abdominal pains ? Swelling of the abdomen that is new, acute ? Fever of 100?F or higher ? ?For urgent or emergent issues, a gastroenterologist can be reached at any hour by calling [phone]. ?Do not use MyChart messaging for urgent concerns.  ? ? ?DIET:  We do recommend a small meal at first, but then you may proceed to your regular diet.  Drink plenty of fluids but you should avoid alcoholic beverages for 24 hours. ? ?ACTIVITY:  You  should plan to take it easy for the rest of today and you should NOT DRIVE or use heavy machinery until tomorrow (because of the sedation medicines used during the test).   ? ?FOLLOW UP: ?Our staff will call the number listed on your records 48-72 hours following your procedure to check on you and address any questions or concerns that you may have regarding the information given to you following your procedure. If we do not reach you, we will leave a message.  We will attempt to reach you two times.  During this call, we will ask if you have developed any symptoms of COVID 19. If you develop any symptoms (ie: fever, flu-like symptoms, shortness of breath, cough etc.) before then, please call [phone].  If you test positive for Covid 19 in the 2 weeks post procedure, please call and report this information to [place].   ? ?If any biopsies were taken you will be contacted by phone or by letter within the next 1-3 weeks.  Please call us at [phone] if you have not heard about the biopsies in 3 weeks.  ? ? ?SIGNATURES/CONFIDENTIALITY: ?You and/or your care partner have signed paperwork which will be entered into your electronic medical record.  These signatures attest to the fact that that the information above on your After Visit Summary has been reviewed and is understood.  Full responsibility of the confidentiality of this discharge information lies with you and/or your care-partner.  ?

## 2021-05-13 NOTE — Progress Notes (Signed)
VS by CW  Pt's states no medical or surgical changes since previsit or office visit.  

## 2021-05-13 NOTE — Progress Notes (Signed)
? ?Referring Provider: Kelton Pillar, MD ?Primary Care Physician:  Kelton Pillar, MD ? ?Indication for Procedure:  Colon cancer screening ? ? ?IMPRESSION:  ?Need for colon cancer screening ?Appropriate candidate for monitored anesthesia care ? ?PLAN: ?Colonoscopy in the [facility] today ? ? ?HPI: Susan Stafford is a 66 y.o. female presents for screening colonoscopy. ? ?No prior colonoscopy or colon cancer screening. ? ?No baseline GI symptoms.  ? ?Sister with colon polyps. No other known family history of colon cancer or polyps. No family history of uterine/endometrial cancer, pancreatic cancer or gastric/stomach cancer. ? ? ?Past Medical History:  ?Diagnosis Date  ? Allergy   ? seasonal  ? Arthritis   ? knees and one hand  ? Diabetes mellitus without complication ([place])   ? FH: colon polyps   ? sister TA +  ? GERD (gastroesophageal reflux disease)   ? Hyperlipidemia   ? Neuromuscular disorder ([place])   ? slight neuropathy feet  ? ? ?Past Surgical History:  ?Procedure Laterality Date  ? KNEE SURGERY Bilateral   ? ? ?Current Outpatient Medications  ?Medication Sig Dispense Refill  ? Black Pepper-Turmeric 3-500 MG CAPS See admin instructions.    ? calcipotriene (DOVONOX) 9.562 % cream 1 application    ? ciprofloxacin (CIPRO) 500 MG tablet 1 tablet    ? fluvastatin (LESCOL) 40 MG capsule Take 40 mg by mouth at bedtime.    ? glucose blood (ONETOUCH VERIO) test strip USE AS DIRECTED TO CHECK BLOOD SUGAR TWICE DAILY    ? glucose blood (ONETOUCH VERIO) test strip USE AS DIRECTED TO CHECK BLOOD SUGAR TWICE DAILY    ? MOUNJARO 7.5 MG/0.5ML Pen Inject into the skin.    ? Multiple Vitamin (MULTIVITAMIN) capsule Take 1 capsule by mouth daily.    ? OneTouch Delica Lancets 13Y MISC See admin instructions.    ? ONETOUCH VERIO test strip SMARTSIG:Via Meter    ? OVER THE COUNTER MEDICATION Magnesium with vitamin D 3 daily    ? SYNJARDY 12.5-500 MG TABS Take by mouth.    ? telmisartan (MICARDIS) 20 MG tablet Take 1 tablet by  mouth daily.    ? vitamin B-12 (CYANOCOBALAMIN) 100 MCG tablet     ? Calcium Carb-Cholecalciferol 600-10 MG-MCG TABS 1 tablet with food (Patient not taking: Reported on 04/29/2021)    ? diclofenac Sodium (VOLTAREN) 1 % GEL See admin instructions.    ? esomeprazole (NEXIUM) 20 MG capsule 1 capsule    ? traMADol (ULTRAM) 50 MG tablet Take 1 tablet (50 mg total) by mouth every 6 (six) hours as needed. 120 tablet 0  ? ?Current Facility-Administered Medications  ?Medication Dose Route Frequency Provider Last Rate Last Admin  ? 0.9 %  sodium chloride infusion  500 mL Intravenous Once Thornton Park, MD      ? ? ?Allergies as of 05/13/2021 - Review Complete 05/13/2021  ?Allergen Reaction Noted  ? Atorvastatin  02/18/2020  ? Celecoxib  05/25/2020  ? Rosuvastatin  02/18/2020  ? Semaglutide Nausea Only and Other (See Comments) 02/18/2020  ? Simvastatin  02/18/2020  ? Sulfamethoxazole-trimethoprim  02/18/2020  ? ? ?Family History  ?Problem Relation Age of Onset  ? Colon polyps Sister   ? Colon cancer Neg Hx   ? Esophageal cancer Neg Hx   ? Rectal cancer Neg Hx   ? Stomach cancer Neg Hx   ? ? ? ?Physical Exam: ?General:   Alert,  well-nourished, pleasant and cooperative in NAD ?Head:  Normocephalic and atraumatic. ?Eyes:  Sclera clear, no icterus.   Conjunctiva pink. ?Mouth:  No deformity or lesions.   ?Neck:  Supple; no masses or thyromegaly. ?Lungs:  Clear throughout to auscultation.   No wheezes. ?Heart:  Regular rate and rhythm; no murmurs. ?Abdomen:  Soft, non-tender, nondistended, normal bowel sounds, no rebound or guarding.  ?Msk:  Symmetrical. No boney deformities ?LAD: No inguinal or umbilical LAD ?Extremities:  No clubbing or edema. ?Neurologic:  Alert and  oriented x4;  grossly nonfocal ?Skin:  No obvious rash or bruise. ?Psych:  Alert and cooperative. Normal mood and affect. ? ? ? ? ?Studies/Results: ?No results found. ? ? ? ?Arial Galligan L. Tarri Glenn, MD, MPH ?05/13/2021, 11:28 AM ? ? ? ?  ?

## 2021-05-13 NOTE — Progress Notes (Signed)
Called to room to assist during endoscopic procedure.  Patient ID and intended procedure confirmed with present staff. Received instructions for my participation in the procedure from the performing physician.  

## 2021-05-13 NOTE — Op Note (Signed)
[facility] ?Patient Name: Susan Stafford ?Procedure Date: 05/13/2021 11:28 AM ?MRN: [id number] ?Endoscopist: Thornton Park MD, MD ?Age: 66 ?Referring MD:  ?Date of Birth: 1955/08/10 ?Gender: Female ?Account #: [id number] ?Procedure:                Colonoscopy ?Indications:              Screening for colorectal malignant neoplasm, This  ?                          is the patient's first colonoscopy ?                          Sister with colon polyps ?Medicines:                Monitored Anesthesia Care ?Procedure:                Pre-Anesthesia Assessment: ?                          - Prior to the procedure, a History and Physical  ?                          was performed, and patient medications and  ?                          allergies were reviewed. The patient's tolerance of  ?                          previous anesthesia was also reviewed. The risks  ?                          and benefits of the procedure and the sedation  ?                          options and risks were discussed with the patient.  ?                          All questions were answered, and informed consent  ?                          was obtained. Prior Anticoagulants: The patient has  ?                          taken no previous anticoagulant or antiplatelet  ?                          agents. ASA Grade Assessment: II - A patient with  ?                          mild systemic disease. After reviewing the risks  ?                          and benefits, the patient was deemed in  ?  satisfactory condition to undergo the procedure. ?                          After obtaining informed consent, the colonoscope  ?                          was passed under direct vision. Throughout the  ?                          procedure, the patient's blood pressure, pulse, and  ?                          oxygen saturations were monitored continuously. The  ?                          Colonoscope was introduced through the  anus and  ?                          advanced to the 3 cm into the ileum. A second  ?                          forward view of the right colon was performed. The  ?                          colonoscopy was performed without difficulty. The  ?                          patient tolerated the procedure well. The quality  ?                          of the bowel preparation was good. The terminal  ?                          ileum, ileocecal valve, appendiceal orifice, and  ?                          rectum were photographed. ?Scope In: 11:35:31 AM ?Scope Out: 11:56:33 AM ?Scope Withdrawal Time: 0 hours 15 minutes 0 seconds  ?Total Procedure Duration: 0 hours 21 minutes 2 seconds  ?Findings:                 The perianal and digital rectal examinations were  ?                          normal. ?                          Many small and large-mouthed diverticula were found  ?                          in the sigmoid colon and descending colon. ?                          Three sessile polyps were found in the descending  ?  colon. The polyps were 2 to 3 mm in size. These  ?                          polyps were removed with a cold snare. Resection  ?                          and retrieval were complete. Estimated blood loss  ?                          was minimal. ?                          Two sessile polyps were found in the ascending  ?                          colon. The polyps were 2 to 4 mm in size. These  ?                          polyps were removed with a cold snare. Resection  ?                          and retrieval were complete. Estimated blood loss  ?                          was minimal. ?                          The exam was otherwise without abnormality on  ?                          direct and retroflexion views. ?Complications:            No immediate complications. ?Estimated Blood Loss:     Estimated blood loss was minimal. ?Impression:               - Diverticulosis in the sigmoid  colon and in the  ?                          descending colon. ?                          - Three 2 to 3 mm polyps in the descending colon,  ?                          removed with a cold snare. Resected and retrieved. ?                          - Two 2 to 4 mm polyps in the ascending colon,  ?                          removed with a cold snare. Resected and retrieved. ?                          - The examination was otherwise normal  on direct  ?                          and retroflexion views. ?Recommendation:           - Patient has a contact number available for  ?                          emergencies. The signs and symptoms of potential  ?                          delayed complications were discussed with the  ?                          patient. Return to normal activities tomorrow.  ?                          Written discharge instructions were provided to the  ?                          patient. ?                          - High fiber diet. ?                          - Continue present medications. ?                          - Await pathology results. ?                          - Repeat colonoscopy date to be determined after  ?                          pending pathology results are reviewed for  ?                          surveillance. Plan colonoscopy in 3 years if at  ?                          least 3 polyps are adenomatous, otherwise repeat  ?                          colonoscopy in 5 years. ?                          - Emerging evidence supports eating a diet of  ?                          fruits, vegetables, grains, calcium, and yogurt  ?                          while reducing red meat and alcohol may reduce the  ?  risk of colon cancer. ?                          - Thank you for allowing me to be involved in your  ?                          colon cancer prevention. ?Thornton Park MD, MD ?05/13/2021 12:01:37 PM ?This report has been signed electronically. ?

## 2021-05-17 ENCOUNTER — Telehealth: Payer: Self-pay | Admitting: *Deleted

## 2021-05-17 ENCOUNTER — Encounter: Payer: Self-pay | Admitting: Gastroenterology

## 2021-05-17 NOTE — Telephone Encounter (Signed)
?  Follow up Call- ? ? ?  05/13/2021  ? 10:45 AM  ?Call back number  ?Post procedure Call Back phone  # [phone]  ?Permission to leave phone message Yes  ?  ? ?Patient questions: ? ?Do you have a fever, pain , or abdominal swelling? No. ?Pain Score  0 * ? ?Have you tolerated food without any problems? Yes.   ? ?Have you been able to return to your normal activities? Yes.   ? ?Do you have any questions about your discharge instructions: ?Diet   No. ?Medications  No. ?Follow up visit  No. ? ?Do you have questions or concerns about your Care? No. ? ?Actions: ?* If pain score is 4 or above: ?No action needed, pain <4. ? ? ?

## 2021-06-14 DIAGNOSIS — L409 Psoriasis, unspecified: Secondary | ICD-10-CM | POA: Diagnosis not present

## 2021-06-14 DIAGNOSIS — E782 Mixed hyperlipidemia: Secondary | ICD-10-CM | POA: Diagnosis not present

## 2021-06-14 DIAGNOSIS — K219 Gastro-esophageal reflux disease without esophagitis: Secondary | ICD-10-CM | POA: Diagnosis not present

## 2021-06-14 DIAGNOSIS — N182 Chronic kidney disease, stage 2 (mild): Secondary | ICD-10-CM | POA: Diagnosis not present

## 2021-06-14 DIAGNOSIS — E1142 Type 2 diabetes mellitus with diabetic polyneuropathy: Secondary | ICD-10-CM | POA: Diagnosis not present

## 2021-06-14 DIAGNOSIS — E1129 Type 2 diabetes mellitus with other diabetic kidney complication: Secondary | ICD-10-CM | POA: Diagnosis not present

## 2021-08-11 ENCOUNTER — Ambulatory Visit
Admission: RE | Admit: 2021-08-11 | Discharge: 2021-08-11 | Disposition: A | Discharge status: Home / Self Care | Payer: Medicare PPO | Source: Ambulatory Visit | Attending: Family Medicine | Admitting: Family Medicine

## 2021-08-11 DIAGNOSIS — E2839 Other primary ovarian failure: Secondary | ICD-10-CM

## 2021-08-11 DIAGNOSIS — M8589 Other specified disorders of bone density and structure, multiple sites: Secondary | ICD-10-CM | POA: Diagnosis not present

## 2021-08-11 DIAGNOSIS — Z780 Asymptomatic menopausal state: Secondary | ICD-10-CM | POA: Diagnosis not present

## 2021-09-06 DIAGNOSIS — N812 Incomplete uterovaginal prolapse: Secondary | ICD-10-CM | POA: Diagnosis not present

## 2021-09-06 DIAGNOSIS — N952 Postmenopausal atrophic vaginitis: Secondary | ICD-10-CM | POA: Diagnosis not present

## 2021-09-20 DIAGNOSIS — N819 Female genital prolapse, unspecified: Secondary | ICD-10-CM | POA: Diagnosis not present

## 2021-09-20 DIAGNOSIS — M6281 Muscle weakness (generalized): Secondary | ICD-10-CM | POA: Diagnosis not present

## 2021-09-20 DIAGNOSIS — R278 Other lack of coordination: Secondary | ICD-10-CM | POA: Diagnosis not present

## 2021-10-10 DIAGNOSIS — M6281 Muscle weakness (generalized): Secondary | ICD-10-CM | POA: Diagnosis not present

## 2021-10-10 DIAGNOSIS — N819 Female genital prolapse, unspecified: Secondary | ICD-10-CM | POA: Diagnosis not present

## 2021-10-10 DIAGNOSIS — R278 Other lack of coordination: Secondary | ICD-10-CM | POA: Diagnosis not present

## 2021-10-19 DIAGNOSIS — R278 Other lack of coordination: Secondary | ICD-10-CM | POA: Diagnosis not present

## 2021-10-19 DIAGNOSIS — M6281 Muscle weakness (generalized): Secondary | ICD-10-CM | POA: Diagnosis not present

## 2021-10-19 DIAGNOSIS — N819 Female genital prolapse, unspecified: Secondary | ICD-10-CM | POA: Diagnosis not present

## 2021-11-02 DIAGNOSIS — M6281 Muscle weakness (generalized): Secondary | ICD-10-CM | POA: Diagnosis not present

## 2021-11-02 DIAGNOSIS — N819 Female genital prolapse, unspecified: Secondary | ICD-10-CM | POA: Diagnosis not present

## 2021-11-02 DIAGNOSIS — R278 Other lack of coordination: Secondary | ICD-10-CM | POA: Diagnosis not present

## 2021-11-18 DIAGNOSIS — R278 Other lack of coordination: Secondary | ICD-10-CM | POA: Diagnosis not present

## 2021-11-18 DIAGNOSIS — N819 Female genital prolapse, unspecified: Secondary | ICD-10-CM | POA: Diagnosis not present

## 2021-11-18 DIAGNOSIS — M6281 Muscle weakness (generalized): Secondary | ICD-10-CM | POA: Diagnosis not present

## 2021-11-23 ENCOUNTER — Other Ambulatory Visit: Payer: Self-pay | Admitting: Endocrinology

## 2021-11-23 DIAGNOSIS — Z1231 Encounter for screening mammogram for malignant neoplasm of breast: Secondary | ICD-10-CM

## 2021-12-06 DIAGNOSIS — R3989 Other symptoms and signs involving the genitourinary system: Secondary | ICD-10-CM | POA: Diagnosis not present

## 2021-12-13 DIAGNOSIS — N952 Postmenopausal atrophic vaginitis: Secondary | ICD-10-CM | POA: Diagnosis not present

## 2021-12-13 DIAGNOSIS — N812 Incomplete uterovaginal prolapse: Secondary | ICD-10-CM | POA: Diagnosis not present

## 2021-12-16 DIAGNOSIS — E782 Mixed hyperlipidemia: Secondary | ICD-10-CM | POA: Diagnosis not present

## 2021-12-16 DIAGNOSIS — E1129 Type 2 diabetes mellitus with other diabetic kidney complication: Secondary | ICD-10-CM | POA: Diagnosis not present

## 2021-12-16 DIAGNOSIS — K219 Gastro-esophageal reflux disease without esophagitis: Secondary | ICD-10-CM | POA: Diagnosis not present

## 2021-12-16 DIAGNOSIS — E1142 Type 2 diabetes mellitus with diabetic polyneuropathy: Secondary | ICD-10-CM | POA: Diagnosis not present

## 2021-12-16 DIAGNOSIS — N182 Chronic kidney disease, stage 2 (mild): Secondary | ICD-10-CM | POA: Diagnosis not present

## 2021-12-28 ENCOUNTER — Ambulatory Visit
Admission: RE | Admit: 2021-12-28 | Discharge: 2021-12-28 | Disposition: A | Discharge status: Home / Self Care | Payer: Medicare PPO | Source: Ambulatory Visit | Attending: Endocrinology | Admitting: Endocrinology

## 2021-12-28 DIAGNOSIS — Z1231 Encounter for screening mammogram for malignant neoplasm of breast: Secondary | ICD-10-CM | POA: Diagnosis not present

## 2022-01-02 DIAGNOSIS — N819 Female genital prolapse, unspecified: Secondary | ICD-10-CM | POA: Diagnosis not present

## 2022-01-02 DIAGNOSIS — M6281 Muscle weakness (generalized): Secondary | ICD-10-CM | POA: Diagnosis not present

## 2022-01-02 DIAGNOSIS — R278 Other lack of coordination: Secondary | ICD-10-CM | POA: Diagnosis not present

## 2022-03-14 ENCOUNTER — Ambulatory Visit: Payer: Medicare PPO | Admitting: Sports Medicine

## 2022-03-14 ENCOUNTER — Ambulatory Visit: Payer: Self-pay

## 2022-03-14 VITALS — BP 110/78 | Ht 63.0 in | Wt 115.0 lb

## 2022-03-14 DIAGNOSIS — M1711 Unilateral primary osteoarthritis, right knee: Secondary | ICD-10-CM

## 2022-03-14 NOTE — Progress Notes (Signed)
Established Patient Office Visit  Subjective   Patient ID: Susan Stafford, female    DOB: 06/16/1955  Age: 67 y.o. MRN: [id number]  Right knee pain.  Susan Stafford is here today with chief complaint of right knee pain that started bothering her before Christmas.  She reports she went on a walk that is not unusual for her and she noticed some knee pain after work.  She denies any injury at that time.  Her pain has since improved however she continues to have some pain in the back of her knee that comes and goes.  She has had some problems with arthritis in the past and meniscectomies however she reports this feels differently.  She denies any buckling or locking but feels like she is getting stiff sometimes.  She flared her pain at the other day when bending down to get something in the pantry.  She is here just to see what is going on with her knee, as she likes to be active.   ROS as listed above in HPI    Objective:     BP 110/78   Ht '5\' 3"'$  (1.6 m)   Wt 115 lb (52.2 kg)   BMI 20.37 kg/m   Physical Exam Vitals reviewed.  Constitutional:      General: She is not in acute distress.    Appearance: Normal appearance. She is normal weight. She is not ill-appearing, toxic-appearing or diaphoretic.  Pulmonary:     Effort: Pulmonary effort is normal.  Neurological:     Mental Status: She is alert.   Right knee: No obvious deformity or asymmetry.  No ecchymosis or effusion.  Tenderness to palpation along medial and lateral joint line greatest along the medial joint line.  Full range of motion flexion, extension.  Strength 5/5 resisted flexion and extension.  Stable to varus and valgus stress.  Negative Lachman.  Negative anterior drawer.  Negative McMurray's.  Normal gait.  Ultrasound of Knee-right  Patellar tendon -visualized, no hypoechoic changes or abnormality seen within the tendon fibers Quad tendon -visualized, no hypoechoic changes or abnormality seen within the  tendon fibers Suprapatellar pouch effusion-none Medial meniscus-visualized in the anterior medial and posterior position.  There did appear to be moderate amount of extrusion on the medial portion of the meniscus with some narrowing of joint space and osteophytes present. Lateral meniscus-visualized in the anterior medial and posterior position.  No obvious tear or extrusion seen. Trochlear groove-visualized, no obvious cartilaginous defects, well-preserved joint space with only a small osteophyte on the medial aspect  Summary and additional findings-overall benign findings of the knee.  There is some medial meniscal extrusion and mild signs of osteoarthritis.  Ultrasound and interpretation by  Dr. Arlis Porta andKarl B. Fields, MD      Assessment & Plan:   Problem List Items Addressed This Visit       Musculoskeletal and Integument   Osteoarthritis of right knee - Primary    Patient has a history of known osteoarthritis I suspect she likely had a meniscal contusion or bony contusion while walking back in December.  Some activities such as deep squatting tend to compress and pinch that area.  Her ultrasound findings are relatively benign.  I am not concerned for any surgical pathology at this time.  Patient should continue with her water aerobics twice a week, walking regimen every other day and weights in the gym.  We did caution her to avoid deep squatting.  She should continue to use  her compression sleeve as needed.  She verbalized understanding.  Follow-up as needed      Relevant Orders   [place] LIMITED JOINT SPACE STRUCTURES LOW RIGHT    Return if symptoms worsen or fail to improve.    Elmore Guise, DO  I observed and examined the patient with the [facility] resident and agree with assessment and plan.  Note reviewed and modified by me. Ila Mcgill, MD

## 2022-03-14 NOTE — Assessment & Plan Note (Signed)
Patient has a history of known osteoarthritis I suspect she likely had a meniscal contusion or bony contusion while walking back in December.  Some activities such as deep squatting tend to compress and pinch that area.  Her ultrasound findings are relatively benign.  I am not concerned for any surgical pathology at this time.  Patient should continue with her water aerobics twice a week, walking regimen every other day and weights in the gym.  We did caution her to avoid deep squatting.  She should continue to use her compression sleeve as needed.  She verbalized understanding.  Follow-up as needed

## 2022-08-16 DIAGNOSIS — E782 Mixed hyperlipidemia: Secondary | ICD-10-CM | POA: Diagnosis not present

## 2022-08-16 DIAGNOSIS — E1129 Type 2 diabetes mellitus with other diabetic kidney complication: Secondary | ICD-10-CM | POA: Diagnosis not present

## 2022-08-16 DIAGNOSIS — N182 Chronic kidney disease, stage 2 (mild): Secondary | ICD-10-CM | POA: Diagnosis not present

## 2022-08-16 DIAGNOSIS — E1142 Type 2 diabetes mellitus with diabetic polyneuropathy: Secondary | ICD-10-CM | POA: Diagnosis not present

## 2022-08-16 DIAGNOSIS — L409 Psoriasis, unspecified: Secondary | ICD-10-CM | POA: Diagnosis not present

## 2022-08-16 DIAGNOSIS — K219 Gastro-esophageal reflux disease without esophagitis: Secondary | ICD-10-CM | POA: Diagnosis not present

## 2022-08-21 ENCOUNTER — Other Ambulatory Visit: Payer: Self-pay | Admitting: Endocrinology

## 2022-08-21 DIAGNOSIS — E1122 Type 2 diabetes mellitus with diabetic chronic kidney disease: Secondary | ICD-10-CM

## 2022-09-14 ENCOUNTER — Ambulatory Visit
Admission: RE | Admit: 2022-09-14 | Discharge: 2022-09-14 | Disposition: A | Discharge status: Home / Self Care | Payer: No Typology Code available for payment source | Source: Ambulatory Visit | Attending: Endocrinology | Admitting: Endocrinology

## 2022-09-14 DIAGNOSIS — E1122 Type 2 diabetes mellitus with diabetic chronic kidney disease: Secondary | ICD-10-CM

## 2022-11-14 ENCOUNTER — Other Ambulatory Visit: Payer: Self-pay | Admitting: Endocrinology

## 2022-11-14 DIAGNOSIS — Z1231 Encounter for screening mammogram for malignant neoplasm of breast: Secondary | ICD-10-CM

## 2022-12-21 DIAGNOSIS — E1129 Type 2 diabetes mellitus with other diabetic kidney complication: Secondary | ICD-10-CM | POA: Diagnosis not present

## 2022-12-21 DIAGNOSIS — Z1389 Encounter for screening for other disorder: Secondary | ICD-10-CM | POA: Diagnosis not present

## 2022-12-28 DIAGNOSIS — E1142 Type 2 diabetes mellitus with diabetic polyneuropathy: Secondary | ICD-10-CM | POA: Diagnosis not present

## 2022-12-28 DIAGNOSIS — E1129 Type 2 diabetes mellitus with other diabetic kidney complication: Secondary | ICD-10-CM | POA: Diagnosis not present

## 2022-12-28 DIAGNOSIS — R82998 Other abnormal findings in urine: Secondary | ICD-10-CM | POA: Diagnosis not present

## 2022-12-28 DIAGNOSIS — M8589 Other specified disorders of bone density and structure, multiple sites: Secondary | ICD-10-CM | POA: Diagnosis not present

## 2022-12-28 DIAGNOSIS — Z0000 Encounter for general adult medical examination without abnormal findings: Secondary | ICD-10-CM | POA: Diagnosis not present

## 2022-12-28 DIAGNOSIS — L409 Psoriasis, unspecified: Secondary | ICD-10-CM | POA: Diagnosis not present

## 2022-12-28 DIAGNOSIS — N182 Chronic kidney disease, stage 2 (mild): Secondary | ICD-10-CM | POA: Diagnosis not present

## 2022-12-28 DIAGNOSIS — K219 Gastro-esophageal reflux disease without esophagitis: Secondary | ICD-10-CM | POA: Diagnosis not present

## 2022-12-28 DIAGNOSIS — D126 Benign neoplasm of colon, unspecified: Secondary | ICD-10-CM | POA: Diagnosis not present

## 2023-01-01 ENCOUNTER — Ambulatory Visit: Payer: Medicare PPO

## 2023-01-18 ENCOUNTER — Ambulatory Visit
Admission: RE | Admit: 2023-01-18 | Discharge: 2023-01-18 | Disposition: A | Discharge status: Home / Self Care | Payer: Medicare PPO | Source: Ambulatory Visit | Attending: Endocrinology | Admitting: Endocrinology

## 2023-01-18 DIAGNOSIS — Z1231 Encounter for screening mammogram for malignant neoplasm of breast: Secondary | ICD-10-CM

## 2023-02-23 DIAGNOSIS — N8111 Cystocele, midline: Secondary | ICD-10-CM | POA: Diagnosis not present

## 2023-02-23 DIAGNOSIS — N898 Other specified noninflammatory disorders of vagina: Secondary | ICD-10-CM | POA: Diagnosis not present

## 2023-02-23 DIAGNOSIS — N8189 Other female genital prolapse: Secondary | ICD-10-CM | POA: Diagnosis not present

## 2023-02-23 DIAGNOSIS — N9089 Other specified noninflammatory disorders of vulva and perineum: Secondary | ICD-10-CM | POA: Diagnosis not present

## 2023-02-23 DIAGNOSIS — N952 Postmenopausal atrophic vaginitis: Secondary | ICD-10-CM | POA: Diagnosis not present

## 2023-03-22 DIAGNOSIS — N952 Postmenopausal atrophic vaginitis: Secondary | ICD-10-CM | POA: Diagnosis not present

## 2023-03-22 DIAGNOSIS — N814 Uterovaginal prolapse, unspecified: Secondary | ICD-10-CM | POA: Diagnosis not present

## 2023-05-14 DIAGNOSIS — E1129 Type 2 diabetes mellitus with other diabetic kidney complication: Secondary | ICD-10-CM | POA: Diagnosis not present

## 2023-05-14 DIAGNOSIS — M8589 Other specified disorders of bone density and structure, multiple sites: Secondary | ICD-10-CM | POA: Diagnosis not present

## 2023-05-14 DIAGNOSIS — E782 Mixed hyperlipidemia: Secondary | ICD-10-CM | POA: Diagnosis not present

## 2023-05-14 DIAGNOSIS — D126 Benign neoplasm of colon, unspecified: Secondary | ICD-10-CM | POA: Diagnosis not present

## 2023-05-14 DIAGNOSIS — E1142 Type 2 diabetes mellitus with diabetic polyneuropathy: Secondary | ICD-10-CM | POA: Diagnosis not present

## 2023-05-14 DIAGNOSIS — K219 Gastro-esophageal reflux disease without esophagitis: Secondary | ICD-10-CM | POA: Diagnosis not present

## 2023-05-14 DIAGNOSIS — N182 Chronic kidney disease, stage 2 (mild): Secondary | ICD-10-CM | POA: Diagnosis not present

## 2023-05-14 DIAGNOSIS — L409 Psoriasis, unspecified: Secondary | ICD-10-CM | POA: Diagnosis not present

## 2023-05-24 NOTE — Progress Notes (Unsigned)
 New Patient Evaluation and Consultation  Referring Provider: Veverly Fells, NP PCP: Adrian Prince, MD Date of Service: 05/25/2023  SUBJECTIVE Chief Complaint: No chief complaint on file.  History of Present Illness: Susan Stafford is a 68 y.o. {ED SANE ZOXW:96045} female seen in consultation at the request of NP Trimble for evaluation of pelvic organ prolapse.    Reports urinary urgency since around 2020 Self directed pelvic floor exercises, pelvic floor PT, vaginal estrogen cream Endometrial polypectomy 06/05/14 Prior evaluation by Dr. Vernon Prey urogynecology at [facility] in 2022. 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   Day time voids 3-4.  Nocturia: 0-1 times per night to void. Voiding dysfunction:  empties bladder well.  Patient does not use a catheter to empty bladder.  When urinating, patient feels a weak stream and difficulty starting urine stream Drinks: ***oz water per day  UTIs: 1 UTI's in the last year.   {ACTIONS;DENIES/REPORTS:21021675::"Denies"} history of blood in urine, kidney or bladder stones, pyelonephritis, bladder cancer, and kidney cancer No results found for the last 90 days.   Pelvic Organ Prolapse Symptoms:                  Patient Admits to a feeling of a bulge the vaginal area. It has been present for {NUMBER 1-10:22536} years.  Patient Admits to seeing a bulge.  This bulge is bothersome.  Bowel Symptom: Bowel movements: 1 time(s) per day Stool consistency: loose Straining: no.  Splinting: no.  Incomplete evacuation: no.  Patient Denies accidental bowel leakage / fecal incontinence Bowel regimen: {bowel regimen:24759} Last  colonoscopy: Results tubular adenoma  HM Colonoscopy          Upcoming     Colonoscopy (Every 3 Years) Next due on 05/13/2024    05/13/2021  COLONOSCOPY   Only the first 1 history entries have been loaded, but more history exists.                Sexual Function Sexually active: yes.  Sexual orientation: Straight Pain with sex: Yes, has discomfort due to prolapse, has discomfort due to dryness  Pelvic Pain Denies pelvic pain   Past Medical History:  Past Medical History:  Diagnosis Date   Allergy    seasonal   Arthritis    knees and one hand   Diabetes mellitus without complication (HCC)    FH: colon polyps    sister TA +   GERD (gastroesophageal reflux disease)    Hyperlipidemia    Neuromuscular disorder (HCC)    slight neuropathy feet     Past Surgical History:   Past Surgical History:  Procedure Laterality Date   KNEE SURGERY Bilateral      Past OB/GYN History: OB History  No obstetric history on file.    Vaginal deliveries: ***,  Forceps/ Vacuum deliveries: ***, Cesarean section: *** Menopausal: Yes, at age 61s, Denies vaginal bleeding since menopause Contraception: ***. Last pap smear: 02/23/22 NILM.  Any history of abnormal pap smears: no.    Component Value Date/Time   DIAGPAP  02/04/2019 0000    - Negative for intraepithelial lesion or malignancy (NILM)   HPVHIGH Negative 02/04/2019 0000   ADEQPAP  02/04/2019 0000    Satisfactory for evaluation;  transformation zone component PRESENT.    Medications: Patient has a current medication list which includes the following prescription(s): black pepper-turmeric, calcipotriene, calcium carb-cholecalciferol, diclofenac sodium, esomeprazole, fluvastatin, onetouch verio, onetouch verio, mounjaro, multivitamin, onetouch delica lancets 33g, onetouch verio, OVER THE COUNTER MEDICATION, synjardy, telmisartan, tramadol, and cyanocobalamin.   Allergies: Patient is allergic to atorvastatin, celecoxib,  rosuvastatin, semaglutide, simvastatin, and sulfamethoxazole-trimethoprim.   Social History:  Social History   Tobacco Use   Smoking status: Never   Smokeless tobacco: Never  Substance Use Topics   Alcohol use: Yes    Comment: wine occasionally   Drug use: Never    Relationship status: married Patient lives with her spouse.   Patient is not employed. Regular exercise: Yes: water aerobics, walking, weight training History of abuse: No  Family History:   Family History  Problem Relation Age of Onset   Colon polyps Sister    Colon cancer Neg Hx    Esophageal cancer Neg Hx    Rectal cancer Neg Hx    Stomach cancer Neg Hx    Breast cancer Neg Hx      Review of Systems: Review of Systems  Constitutional:  Negative for fever, malaise/fatigue and weight loss.  Respiratory:  Negative for cough, shortness of breath and wheezing.   Cardiovascular:  Negative for chest pain, palpitations and leg swelling.  Gastrointestinal:  Negative for abdominal pain, blood in stool and constipation.  Genitourinary:  Negative for dysuria, frequency, hematuria and urgency.  Skin:  Negative for rash.  Neurological:  Negative for dizziness, weakness and headaches.  Endo/Heme/Allergies:  Does not bruise/bleed easily.       Hot flashes  Psychiatric/Behavioral:  Negative for depression. The patient is not nervous/anxious.      OBJECTIVE Physical Exam: There were no vitals filed for this visit.  Physical Exam Constitutional:      General: She is not in acute distress.    Appearance: Normal appearance.  Genitourinary:     Bladder and urethral meatus normal.     No lesions in the vagina.     Right Labia: No rash, tenderness, lesions, skin changes or Bartholin's cyst.    Left Labia: No tenderness, lesions, skin changes, Bartholin's cyst or rash.    No vaginal discharge, erythema, tenderness, bleeding, ulceration or granulation tissue.      Right Adnexa: not tender, not full and no mass  present.    Left Adnexa: not tender, not full and no mass present.    No cervical motion tenderness, discharge, friability, lesion, polyp or nabothian cyst.     Uterus is not enlarged, fixed, tender or irregular.     No uterine mass detected.    Urethral meatus caruncle not present.    No urethral prolapse, tenderness, mass, hypermobility or discharge present.     Bladder is not tender, urgency on palpation not present and masses not present.      Levator ani not tender, obturator internus not tender, no asymmetrical contractions present and no pelvic spasms present.    Anal wink present and BC reflex present. Cardiovascular:     Rate and Rhythm: Normal rate.  Pulmonary:     Effort: Pulmonary effort is normal. No respiratory distress.  Abdominal:     General: There is no distension.     Palpations: Abdomen is soft. There is no mass.     Tenderness: There is no abdominal tenderness.     Hernia: No hernia is present.  Neurological:     Mental Status:  She is alert.  Vitals reviewed. Exam conducted with a chaperone present.     POP-Q:   POP-Q                                               Aa                                               Ba                                                 C                                                Gh                                               Pb                                               tvl                                                Ap                                               Bp                                                 D      Rectal Exam:  Normal sphincter tone, {rectocele:24766} distal rectocele, enterocoele {DESC; PRESENT/NOT PRESENT:21021351}, no rectal masses, {sign of:24767} dyssynergia when asking the patient to bear down.  Post-Void Residual (PVR) by Bladder Scan: In order to evaluate bladder emptying, we discussed obtaining a postvoid residual and patient agreed to this procedure.  Procedure:  The ultrasound unit was placed on the patient's abdomen in the suprapubic region after the patient had voided.      Laboratory Results: No results found for: "COLORU", "CLARITYU", "GLUCOSEUR", "BILIRUBINUR", "KETONESU", "SPECGRAV", "RBCUR", "PHUR", "PROTEINUR", "UROBILINOGEN", "LEUKOCYTESUR"  No results found for: "CREATININE"  No results found for: "HGBA1C"  No results found for: "HGB"   ASSESSMENT AND PLAN Ms. Pedley is a 68 y.o. with: No diagnosis found.  There are no diagnoses linked to this encounter.   Loleta Chance, MD

## 2023-05-25 ENCOUNTER — Encounter: Payer: Self-pay | Admitting: Obstetrics

## 2023-05-25 ENCOUNTER — Ambulatory Visit: Admitting: Obstetrics

## 2023-05-25 VITALS — BP 118/78 | HR 75 | Ht 63.19 in | Wt 120.6 lb

## 2023-05-25 DIAGNOSIS — E119 Type 2 diabetes mellitus without complications: Secondary | ICD-10-CM | POA: Diagnosis not present

## 2023-05-25 DIAGNOSIS — N8110 Cystocele, unspecified: Secondary | ICD-10-CM | POA: Diagnosis not present

## 2023-05-25 DIAGNOSIS — R339 Retention of urine, unspecified: Secondary | ICD-10-CM

## 2023-05-25 DIAGNOSIS — N898 Other specified noninflammatory disorders of vagina: Secondary | ICD-10-CM

## 2023-05-25 LAB — POCT URINALYSIS DIPSTICK
Bilirubin, UA: NEGATIVE
Blood, UA: NEGATIVE
Glucose, UA: POSITIVE — AB
Ketones, UA: NEGATIVE
Leukocytes, UA: NEGATIVE
Nitrite, UA: NEGATIVE
Protein, UA: NEGATIVE
Spec Grav, UA: 1.015 (ref 1.010–1.025)
Urobilinogen, UA: 0.2 U/dL
pH, UA: 7 (ref 5.0–8.0)

## 2023-05-25 NOTE — Assessment & Plan Note (Addendum)
-   T2DM with neuropathy, HbA1C  6.6 in 04/2023 at goal < 8 preop - discussed risk of transient hyperglycemia postop that may require adjustment of medications

## 2023-05-25 NOTE — Patient Instructions (Addendum)
 You have a stage 3 (out of 4) prolapse.  We discussed the fact that it is not life threatening but there are several treatment options. For treatment of pelvic organ prolapse, we discussed options for management including expectant management, conservative management, and surgical management, such as Kegels, a pessary, pelvic floor physical therapy, and specific surgical procedures.     For vaginal atrophy (thinning of the vaginal tissue that can cause dryness and burning) and UTI prevention we discussed estrogen replacement in the form of vaginal cream.   Start vaginal estrogen therapy 1g 2 times weekly at night. This can be placed with your finger or an applicator inside the vagina and around the urethra.  Please let us know if the prescription is too expensive and we can look for alternative options.   Is vaginal estrogen therapy safe for me? Vaginal estrogen preparations act on the vaginal skin, and only a very tiny amount is absorbed into the bloodstream (0.01%).  They work in a similar way to hand or face cream.  There is minimal absorption and they are therefore perfectly safe. If you have had breast cancer and have persistent troublesome symptoms which aren't settling with vaginal moisturisers and lubricants, local estrogen treatment may be a possibility, but consultation with your oncologist should take place first.   - discussed proper vulvar care, warm compression, avoid pad use, cotton only underwear and barrier ointment if needed   Start splinting (gently pushing your bulge into the vagina) to ensure bladder emptying.   Please return with a full bladder for urodynamic testing.   Please call radiology at [phone] to schedule your imaging study today

## 2023-05-25 NOTE — Assessment & Plan Note (Addendum)
-   POCT + glucose - sensation of incomplete emptying, difficulty with initiation of voids with via catheterization - repeat at follow-up - discussed multiple underlying etiology such as outlet obstruction from prolapse vs. DM associated peripheral neuropathy - encouraged to continue tight glycemic control  - scheduled urodynamics to assess detrusor function and r/o SUI prior to prolapse surgery - encouraged splinting to ensure bladder emptying and trial of pessary

## 2023-05-25 NOTE — Assessment & Plan Note (Addendum)
-   cherry tomato size vaginal bulge - tried self directed pelvic floor exercises, pelvic floor PT - prior evaluation in 2022 - For treatment of pelvic organ prolapse, we discussed options for management including expectant management, conservative management, and surgical management, such as Kegels, a pessary, pelvic floor physical therapy, and specific surgical procedures. - pending urodynamics - pt desires surgical intervention, sexually active We discussed two options for prolapse repair:  1) vaginal repair without mesh - Pros - safer, no mesh complications - Cons - not as strong as mesh repair, higher risk of recurrence  2) laparoscopic repair with mesh - Pros - stronger, better long-term success - Cons - risks of mesh implant (erosion into vagina or bladder, adhering to the rectum, pain) - these risks are lower than with a vaginal mesh but still exist - encouraged to consider pessary placement if incomplete emptying on urodynamics - pending TVUS with history of postmenopausal bleeding

## 2023-05-25 NOTE — Assessment & Plan Note (Addendum)
-   using vaginal estrogen cream 0.5g 3x/week, continues to report vaginal irritation - For symptomatic vaginal atrophy options include lubrication with a water-based lubricant, personal hygiene measures and barrier protection against wetness, and estrogen replacement in the form of vaginal cream, vaginal tablets, or a time-released vaginal ring.   - pending Nuswab to r/o infectious etiology - discussed importance of tight glycemic control and possible change in DM medications to minimize risk of recurrent vaginitis - discussed possible vulvar biopsy if persistent vulvar irritation to r/o dermatoses - discussed proper vulvar care, warm compression, avoid pad use, cotton only underwear and barrier ointment if needed

## 2023-05-28 ENCOUNTER — Other Ambulatory Visit: Payer: Self-pay

## 2023-05-28 ENCOUNTER — Other Ambulatory Visit (HOSPITAL_COMMUNITY)
Admission: RE | Admit: 2023-05-28 | Discharge: 2023-05-28 | Disposition: A | Discharge status: Home / Self Care | Source: Ambulatory Visit | Attending: Obstetrics | Admitting: Obstetrics

## 2023-05-28 DIAGNOSIS — N898 Other specified noninflammatory disorders of vagina: Secondary | ICD-10-CM

## 2023-05-29 ENCOUNTER — Other Ambulatory Visit: Payer: Self-pay | Admitting: Obstetrics

## 2023-05-29 DIAGNOSIS — Z8742 Personal history of other diseases of the female genital tract: Secondary | ICD-10-CM

## 2023-05-29 DIAGNOSIS — N8110 Cystocele, unspecified: Secondary | ICD-10-CM

## 2023-05-29 NOTE — Progress Notes (Signed)
TVUS ordered

## 2023-05-30 ENCOUNTER — Encounter: Payer: Self-pay | Admitting: Obstetrics

## 2023-05-30 LAB — CERVICOVAGINAL ANCILLARY ONLY
Bacterial Vaginitis (gardnerella): NEGATIVE
Candida Glabrata: NEGATIVE
Candida Vaginitis: POSITIVE — AB
Comment: NEGATIVE
Comment: NEGATIVE
Comment: NEGATIVE

## 2023-05-30 MED ORDER — FLUCONAZOLE 150 MG PO TABS
150.0000 mg | ORAL_TABLET | Freq: Once | ORAL | 1 refills | Status: AC
Start: 2023-05-30 — End: 2023-05-30

## 2023-05-30 NOTE — Addendum Note (Signed)
 Addended byWyonia Hefty T on: 05/30/2023 06:07 PM   Modules accepted: Orders

## 2023-06-01 ENCOUNTER — Ambulatory Visit (HOSPITAL_COMMUNITY)
Admission: RE | Admit: 2023-06-01 | Discharge: 2023-06-01 | Disposition: A | Discharge status: Home / Self Care | Source: Ambulatory Visit | Attending: Obstetrics | Admitting: Obstetrics

## 2023-06-01 DIAGNOSIS — N8110 Cystocele, unspecified: Secondary | ICD-10-CM | POA: Insufficient documentation

## 2023-06-01 DIAGNOSIS — Z8742 Personal history of other diseases of the female genital tract: Secondary | ICD-10-CM | POA: Diagnosis not present

## 2023-06-01 DIAGNOSIS — N950 Postmenopausal bleeding: Secondary | ICD-10-CM | POA: Diagnosis not present

## 2023-06-04 ENCOUNTER — Encounter: Payer: Self-pay | Admitting: Obstetrics

## 2023-06-04 DIAGNOSIS — H524 Presbyopia: Secondary | ICD-10-CM | POA: Diagnosis not present

## 2023-06-04 DIAGNOSIS — H2513 Age-related nuclear cataract, bilateral: Secondary | ICD-10-CM | POA: Diagnosis not present

## 2023-06-04 DIAGNOSIS — H35362 Drusen (degenerative) of macula, left eye: Secondary | ICD-10-CM | POA: Diagnosis not present

## 2023-06-04 DIAGNOSIS — H5213 Myopia, bilateral: Secondary | ICD-10-CM | POA: Diagnosis not present

## 2023-06-04 DIAGNOSIS — H52223 Regular astigmatism, bilateral: Secondary | ICD-10-CM | POA: Diagnosis not present

## 2023-06-04 DIAGNOSIS — E119 Type 2 diabetes mellitus without complications: Secondary | ICD-10-CM | POA: Diagnosis not present

## 2023-06-06 ENCOUNTER — Encounter: Admitting: Obstetrics and Gynecology

## 2023-06-29 ENCOUNTER — Ambulatory Visit: Payer: Medicare PPO | Admitting: Obstetrics

## 2023-07-03 ENCOUNTER — Ambulatory Visit (INDEPENDENT_AMBULATORY_CARE_PROVIDER_SITE_OTHER): Admitting: Obstetrics and Gynecology

## 2023-07-03 ENCOUNTER — Encounter: Payer: Self-pay | Admitting: Obstetrics and Gynecology

## 2023-07-03 ENCOUNTER — Telehealth: Payer: Self-pay

## 2023-07-03 VITALS — BP 115/78 | HR 103

## 2023-07-03 DIAGNOSIS — R350 Frequency of micturition: Secondary | ICD-10-CM | POA: Diagnosis not present

## 2023-07-03 DIAGNOSIS — N393 Stress incontinence (female) (male): Secondary | ICD-10-CM | POA: Diagnosis not present

## 2023-07-03 DIAGNOSIS — N8110 Cystocele, unspecified: Secondary | ICD-10-CM | POA: Diagnosis not present

## 2023-07-03 DIAGNOSIS — R339 Retention of urine, unspecified: Secondary | ICD-10-CM

## 2023-07-03 LAB — POCT URINALYSIS DIPSTICK
Bilirubin, UA: NEGATIVE
Blood, UA: NEGATIVE
Glucose, UA: POSITIVE — AB
Ketones, UA: NEGATIVE
Leukocytes, UA: NEGATIVE
Nitrite, UA: NEGATIVE
Protein, UA: NEGATIVE
Spec Grav, UA: 1.01 (ref 1.010–1.025)
Urobilinogen, UA: 0.2 U/dL
pH, UA: 6.5 (ref 5.0–8.0)

## 2023-07-03 NOTE — Telephone Encounter (Signed)
 Patient called and states the pessary placed this morning has fallen out.  Please give instructions on next steps. Instructed to wash and bag the pessary and we would be in touch.

## 2023-07-03 NOTE — Patient Instructions (Signed)
Taking Care of Yourself after Urodynamics   Drink plenty of water for a day or two following your procedure. Try to have about 8 ounces (one cup) at a time, and do this 6 times or more per day unless you have fluid restrictitons AVOID irritative beverages such as coffee, tea, soda, alcoholic or citrus drinks for a day or two, as this may cause burning with urination. You may experience some discomfort or a burning sensation with urination after having this procedure. You can use over the counter Azo or pyridium to help with burning and follow the instructions on the packaging. If it does not improve within 1-2 days, or other symptoms appear (fever, chills, or difficulty urinating) call the office to speak to a nurse.  You may return to normal daily activities such as work, school, driving, exercising and housework on the day of the procedure. If your doctor gave you a prescription, take it as ordered.

## 2023-07-03 NOTE — Progress Notes (Signed)
 [facility] Urodynamics Procedure  Referring Physician: Rosslyn Coons, MD Date of Procedure: 07/03/2023  Susan Stafford is a 68 y.o. female who presents for urodynamic evaluation. Indication(s) for study: occult SUIand incomplete bladder emptying.   Vital Signs: BP 115/78   Pulse (!) 103   Laboratory Results: A catheterized urine specimen revealed:  POC urine:  Lab Results  Component Value Date   COLORU yellow 07/03/2023   CLARITYU clear 07/03/2023   GLUCOSEUR Positive (A) 07/03/2023   BILIRUBINUR negative 07/03/2023   KETONESU negative 07/03/2023   SPECGRAV 1.010 07/03/2023   RBCUR negative 07/03/2023   PHUR 6.5 07/03/2023   PROTEINUR Negative 07/03/2023   UROBILINOGEN 0.2 07/03/2023   LEUKOCYTESUR Negative 07/03/2023     Voiding Diary: Deferred  Procedure Timeout:  The correct patient was verified and the correct procedure was verified. The patient was in the correct position and safety precautions were reviewed based on at the patient's history.  Urodynamic Procedure A 44F dual lumen urodynamics catheter was placed under sterile conditions into the patient's bladder. A 44F catheter was placed into the rectum in order to measure abdominal pressure. EMG patches were placed in the appropriate position.  All connections were confirmed and calibrations/adjusted made. Saline was instilled into the bladder through the dual lumen catheters.  Cough/valsalva pressures were measured periodically during filling.  Patient was allowed to void.  The bladder was then emptied of its residual.  UROFLOW: Revealed a Qmax of 3.2 mL/sec.  She voided 63 mL and had a residual of 200 mL.  It was a intermittent pattern and represented normal habits though interpretation limited due to low voided volume.  CMG: This was performed with sterile water in the sitting position at a fill rate of 30 mL/min.    First sensation of fullness was 161 mLs,  First urge was 252 mLs,   Strong urge was 416 mLs and  Capacity was 821 mLs  Stress incontinence was demonstrated, but only with standing Highest negative Barrier CLPP was 107 cmH20 at 441 ml. Highest positive Barrier VLPP was 32 cmH20 at 441 ml, while standing.  Detrusor function was overactive, with phasic contractions seen.  The first occurred at 130 mL to 5 cm of water and was associated with urge.  Compliance:  Normal. End fill detrusor pressure was 14cmH20.  Calculated compliance was 58.65mL/cmH20  UPP: MUCP with barrier reduction was 71 cm of water.    MICTURITION STUDY: Voiding was performed with reduction using scopettes in the sitting position.  Pdet at Qmax was   42 cm of water.  Qmax was 12.3 mL/sec.  It was a interrupted pattern.  She voided 171 mL and had a residual of 650 mL.  It was a volitional void, sustained detrusor contraction was present and abdominal straining was not present  EMG: This was performed with patches.  She had voluntary contractions, recruitment with fill was present and urethral sphincter was not relaxed with void.  The details of the procedure with the study tracings have been scanned into EPIC.   Urodynamic Impression:  1. Sensation was reduced; capacity was normal   2. Stress Incontinence was demonstrated at normal pressures; 3. Detrusor Overactivity was demonstrated without leakage. 4. Emptying was dysfunctional with a elevated PVR ( ), a sustained detrusor contraction present,  abdominal straining not present, dyssynergic urethral sphincter activity on EMG.  Plan: - #4 Short Stem Gellhorn pessary (Lot [id number]) placed without difficulty. We discussed that she will leave this in place until she follows  up with Dr. Aron Lard.  -Depending on her emptying with pessary, may consider Renal US  to evaluate for Hydro due to repeated representation of incomplete emptying making her between a moderate and high risk on risk stratification scale.  -Discussed with patient that the  concern is fluid backing up onto the kidneys.  -Patient to continue vaginal estrogen cream x2 weekly.

## 2023-07-05 NOTE — Telephone Encounter (Signed)
 Patient will keep scheduled appointment with Dr. Aron Lard. She has the pessary and will bring to her appointment.

## 2023-07-10 ENCOUNTER — Encounter: Payer: Self-pay | Admitting: Obstetrics

## 2023-07-10 ENCOUNTER — Ambulatory Visit: Admitting: Obstetrics

## 2023-07-10 VITALS — BP 126/82 | HR 116

## 2023-07-10 DIAGNOSIS — N393 Stress incontinence (female) (male): Secondary | ICD-10-CM | POA: Diagnosis not present

## 2023-07-10 DIAGNOSIS — R339 Retention of urine, unspecified: Secondary | ICD-10-CM

## 2023-07-10 DIAGNOSIS — N8110 Cystocele, unspecified: Secondary | ICD-10-CM

## 2023-07-10 DIAGNOSIS — Z4689 Encounter for fitting and adjustment of other specified devices: Secondary | ICD-10-CM | POA: Diagnosis not present

## 2023-07-10 DIAGNOSIS — Z960 Presence of urogenital implants: Secondary | ICD-10-CM | POA: Insufficient documentation

## 2023-07-10 NOTE — Assessment & Plan Note (Addendum)
-   07/03/23 POCT + glucose only - sensation of incomplete emptying, difficulty with initiation of voids with via catheterization with repeat at today - repeat at follow-up with pessary in place - discussed multiple underlying etiology such as outlet obstruction from prolapse vs. DM associated peripheral neuropathy - encouraged to continue tight glycemic control  - UDS showed SUI, OAB with pelvic floor dyssynergia and elevated PVR - encouraged splinting to ensure bladder emptying if pessary expels and trial of size 5 short stem gellhorn pessary - failed size 4 short stem gellhorn pessary - discussed increased risk of prolonged urinary retention if PVR remains elevated with reduction of prolapse - Cr 0.68 on 01/10/23 - consider renal ultrasound if persistently elevated PVR

## 2023-07-10 NOTE — Assessment & Plan Note (Signed)
-   cherry tomato size vaginal bulge - tried self directed pelvic floor exercises, pelvic floor PT - prior evaluation in 2022 - failed size 4 short stem gellhorn pessary, trial of size 5 short stem gellhorn pessary - For treatment of pelvic organ prolapse, we discussed options for management including expectant management, conservative management, and surgical management, such as Kegels, a pessary, pelvic floor physical therapy, and specific surgical procedures. - pt desires surgical intervention, sexually active We discussed two options for prolapse repair:  1) vaginal repair without mesh - Pros - safer, no mesh complications - Cons - not as strong as mesh repair, higher risk of recurrence  2) laparoscopic repair with mesh - Pros - stronger, better long-term success - Cons - risks of mesh implant (erosion into vagina or bladder, adhering to the rectum, pain) - these risks are lower than with a vaginal mesh but still exist - patient considering vaginal hysterectomy, BSO, uterosacral ligament suspension, anterior/posterior repair, perineorrhaphy - TVUS with thickened endometrium with history of postmenopausal bleeding, will proceed with EMB after pessary removal

## 2023-07-10 NOTE — Assessment & Plan Note (Signed)
-   discussed risk of change in urinary or bowel symptoms, vaginal ulceration, discharge, bleeding, fistula formation. Explained that pt may require multiple sizes and types for fitting.  - failed size 4 short stem gellhorn pessary  - trial of size 5 short stem gellhorn pessary

## 2023-07-10 NOTE — Progress Notes (Signed)
 [facility] Return Visit  SUBJECTIVE  History of Present Illness: Susan Stafford is a 68 y.o. female seen in follow-up for stage III pelvic organ prolapse, vaginal itching, incomplete bladder emptying, and T2DM. Plan at last visit was vaginal estrogen, urodynamics and trial of size 4 short stem gellhorn pessary placement.   Urodynamic Impression 07/03/23:  1. Sensation was reduced; capacity was normal   2. Stress Incontinence was demonstrated at normal pressures; 3. Detrusor Overactivity was demonstrated without leakage. 4. Emptying was dysfunctional with a elevated PVR ( ), a sustained detrusor contraction present,  abdominal straining not present, dyssynergic urethral sphincter activity on EMG.  Pessary came out day of placement after squats during workout. Reports active lifestyle with water aerobics and squats Vaginal bulge sensation unchanged.  Tried pelvic floor exercises, pelvic floor PT. Desires surgical intervention.  Cares for her grandson 3x/week.  Reports walking daily.  Cr 0.68 on 01/10/23  TVUS 06/02/23 "EXAM: PELVIC ULTRASOUND   TECHNIQUE: Transvaginal pelvic duplex ultrasound using B-mode/gray scaled imaging.   COMPARISON: None provided   CLINICAL HISTORY: History of postmenopausal bleeding.   FINDINGS:   UTERUS: Uterus measures 6.8x3.9x5.1 cm (calculated volume 70.5 ml). 1.7x1.9x2.1 cm intramural right fundal fibroid.   ENDOMETRIAL STRIPE: Endometrial stripe measures 5 mm, within normal limits   RIGHT OVARY: Not discretely visualized. No adnexal mass is seen.   LEFT OVARY: Not discretely visualized. No adnexal mass is seen.   FREE FLUID: No evidence of free fluid.   IMPRESSION: 1. Endometrial stripe measures 5 mm, within normal limits. 2. 2.1 cm intramural right fundal fibroid.   Electronically signed by: Zadie Herter MD 06/02/2023 02:37 AM EDT RP Workstation: [id number]  Past Medical History: Patient  has a  past medical history of Allergy, Arthritis, Diabetes mellitus without complication (HCC), FH: colon polyps, GERD (gastroesophageal reflux disease), Hyperlipidemia, and Neuromuscular disorder (HCC).   Past Surgical History: She  has a past surgical history that includes Knee surgery (Bilateral).   Medications: She has a current medication list which includes the following prescription(s): black pepper-turmeric, calcipotriene, diclofenac sodium, esomeprazole, fluvastatin, multivitamin, onetouch delica lancets 33g, synjardy, telmisartan, cyanocobalamin, vitamin d, and mounjaro.   Allergies: Patient is allergic to atorvastatin, celecoxib, rosuvastatin, semaglutide, simvastatin, and sulfamethoxazole-trimethoprim.   Social History: Patient  reports that she has never smoked. She has never used smokeless tobacco. She reports current alcohol use. She reports that she does not use drugs.     OBJECTIVE     Physical Exam: Vitals:   07/10/23 1434  BP: 126/82  Pulse: (!) 116   Physical Exam Constitutional:      General: She is not in acute distress.    Appearance: Normal appearance.  Genitourinary:     Urethral meatus normal.     Genitourinary Comments: A size 5 ring with support pessary was placed, reports discomfort during valsalva. A size 5 long stem gellhorn pessary was fitted. It was comfortable, stayed in place with valsalva and was an appropriate size on examination, with one finger fitting between the pessary and the vaginal walls.        Right Labia: No rash, tenderness, lesions, skin changes or Bartholin's cyst.    Left Labia: No tenderness, lesions, skin changes, Bartholin's cyst or rash.    No vaginal discharge, erythema, tenderness, bleeding or ulceration.     Anterior and apical vaginal prolapse present.    Urethral meatus caruncle not present. Cardiovascular:     Rate and Rhythm: Tachycardia present.  Pulmonary:  Effort: Pulmonary effort is normal. No respiratory  distress.  Neurological:     Mental Status: She is alert.  Vitals reviewed. Exam conducted with a chaperone present.     Post Void Residual - 07/10/23 1501       Post Void Residual   Post Void Residual 11 mL            Straight Catheterization Procedure for PVR: After verbal consent was obtained from the patient for catheterization to assess bladder emptying and residual volume the urethra and surrounding tissues were prepped with betadine and an in and out catheterization was performed.  PVR was .  Urine appeared clear yellow. The patient tolerated the procedure well.       ASSESSMENT AND PLAN    Ms. Susan Stafford is a 68 y.o. with:  1. Pelvic organ prolapse quantification stage 3 cystocele   2. Incomplete bladder emptying   3. SUI (stress urinary incontinence, female)   4. Fitting and adjustment of pessary     Pelvic organ prolapse quantification stage 3 cystocele Assessment & Plan: - cherry tomato size vaginal bulge - tried self directed pelvic floor exercises, pelvic floor PT - prior evaluation in 2022 - failed size 4 short stem gellhorn pessary, trial of size 5 short stem gellhorn pessary - For treatment of pelvic organ prolapse, we discussed options for management including expectant management, conservative management, and surgical management, such as Kegels, a pessary, pelvic floor physical therapy, and specific surgical procedures. - pt desires surgical intervention, sexually active We discussed two options for prolapse repair:  1) vaginal repair without mesh - Pros - safer, no mesh complications - Cons - not as strong as mesh repair, higher risk of recurrence  2) laparoscopic repair with mesh - Pros - stronger, better long-term success - Cons - risks of mesh implant (erosion into vagina or bladder, adhering to the rectum, pain) - these risks are lower than with a vaginal mesh but still exist - patient considering vaginal hysterectomy, BSO, uterosacral ligament  suspension, anterior/posterior repair, perineorrhaphy - TVUS with thickened endometrium with history of postmenopausal bleeding, will proceed with EMB after pessary removal   Incomplete bladder emptying Assessment & Plan: - 07/03/23 POCT + glucose only - sensation of incomplete emptying, difficulty with initiation of voids with via catheterization with repeat at today - repeat at follow-up with pessary in place - discussed multiple underlying etiology such as outlet obstruction from prolapse vs. DM associated peripheral neuropathy - encouraged to continue tight glycemic control  - UDS showed SUI, OAB with pelvic floor dyssynergia and elevated PVR - encouraged splinting to ensure bladder emptying if pessary expels and trial of size 5 short stem gellhorn pessary - failed size 4 short stem gellhorn pessary - discussed increased risk of prolonged urinary retention if PVR remains elevated with reduction of prolapse - Cr 0.68 on 01/10/23 - consider renal ultrasound if persistently elevated PVR   SUI (stress urinary incontinence, female) Assessment & Plan: - denies symptoms, occult SUI noted on urodynamic testing - For treatment of stress urinary incontinence,  non-surgical options include expectant management, weight loss, physical therapy, as well as a pessary.  Surgical options include a midurethral sling, Burch urethropexy, and transurethral injection of a bulking agent. - discussed need to show resolution of incomplete emptying due to increased risk of urinary retention with anti-incontinence at the time of prolapse surgery.  - discussed office procedure with urethral bulking (Bulkamid). We discussed success rate of approximately 70-80% and possible  need for second injection. We reviewed that this is not a permanent procedure and the Bulkamid does dissolve over time. Risks reviewed including injury to bladder or urethra, UTI, urinary retention and hematuria.  - Sling: The  effectiveness of a midurethral vaginal mesh sling is approximately 85%, and thus, there will be times when you may leak urine after surgery, especially if your bladder is full or if you have a strong cough. There is a balance between making the sling tight enough to treat your leakage but not too tight so that you have long-term difficulty emptying your bladder. A mesh sling will not directly treat overactive bladder/urge incontinence and may worsen it.  There is an FDA safety notification on vaginal mesh procedures for prolapse but NOT mesh slings. We have extensive experience and training with mesh placement and we have close postoperative follow up to identify any potential complications from mesh. It is important to realize that this mesh is a permanent implant that cannot be easily removed. There are rare risks of mesh exposure (2-4%), pain with intercourse (0-7%), and infection (<1%). The risk of mesh exposure if more likely in a woman with risks for poor healing (prior radiation, poorly controlled diabetes, or immunocompromised). The risk of new or worsened chronic pain after mesh implant is more common in women with baseline chronic pain and/or poorly controlled anxiety or depression. Approximately 2-4% of patients will experience longer-term post-operative voiding dysfunction that may require surgical revision of the sling. We also reviewed that postoperatively, her stream may not be as strong as before surgery.  - encouraged pt to consider options and return for repeat PVR if size 5 short stem gellhorn pessary remains in place   Fitting and adjustment of pessary Assessment & Plan: - discussed risk of change in urinary or bowel symptoms, vaginal ulceration, discharge, bleeding, fistula formation. Explained that pt may require multiple sizes and types for fitting.  - failed size 4 short stem gellhorn pessary  - trial of size 5 short stem gellhorn pessary    Time spent: I spent 52 minutes dedicated  to the care of this patient on the date of this encounter to include pre-visit review of records, face-to-face time with the patient discussing stage III pelvic organ prolapse, SUI, incomplete bladder emptying, and post visit documentation and ordering medication/ testing.   Darlene Ehlers, MD

## 2023-07-10 NOTE — Assessment & Plan Note (Signed)
-   denies symptoms, occult SUI noted on urodynamic testing - For treatment of stress urinary incontinence,  non-surgical options include expectant management, weight loss, physical therapy, as well as a pessary.  Surgical options include a midurethral sling, Burch urethropexy, and transurethral injection of a bulking agent. - discussed need to show resolution of incomplete emptying due to increased risk of urinary retention with anti-incontinence at the time of prolapse surgery.  - discussed office procedure with urethral bulking (Bulkamid). We discussed success rate of approximately 70-80% and possible need for second injection. We reviewed that this is not a permanent procedure and the Bulkamid does dissolve over time. Risks reviewed including injury to bladder or urethra, UTI, urinary retention and hematuria.  - Sling: The effectiveness of a midurethral vaginal mesh sling is approximately 85%, and thus, there will be times when you may leak urine after surgery, especially if your bladder is full or if you have a strong cough. There is a balance between making the sling tight enough to treat your leakage but not too tight so that you have long-term difficulty emptying your bladder. A mesh sling will not directly treat overactive bladder/urge incontinence and may worsen it.  There is an FDA safety notification on vaginal mesh procedures for prolapse but NOT mesh slings. We have extensive experience and training with mesh placement and we have close postoperative follow up to identify any potential complications from mesh. It is important to realize that this mesh is a permanent implant that cannot be easily removed. There are rare risks of mesh exposure (2-4%), pain with intercourse (0-7%), and infection (<1%). The risk of mesh exposure if more likely in a woman with risks for poor healing (prior radiation, poorly controlled diabetes, or immunocompromised). The risk of new or worsened chronic pain after mesh  implant is more common in women with baseline chronic pain and/or poorly controlled anxiety or depression. Approximately 2-4% of patients will experience longer-term post-operative voiding dysfunction that may require surgical revision of the sling. We also reviewed that postoperatively, her stream may not be as strong as before surgery.  - encouraged pt to consider options and return for repeat PVR if size 5 short stem gellhorn pessary remains in place

## 2023-07-10 NOTE — Patient Instructions (Signed)
 We have placed a size 5 short stem gellhorn pessary. This will keep the bulge inside and prevent it from getting worse. Please return in 1-2 weeks to reassess your bladder emptying.  We discussed 2 options for stress urinary incontinence:  1) office procedure with urethral bulking (Bulkamid). We discussed success rate of approximately 70-80% and possible need for second injection. We reviewed that this is not a permanent procedure and the Bulkamid does dissolve over time. Risks reviewed including injury to bladder or urethra, UTI, urinary retention and hematuria.     2) Sling: The effectiveness of a midurethral vaginal mesh sling is approximately 85%, and thus, there will be times when you may leak urine after surgery, especially if your bladder is full or if you have a strong cough. There is a balance between making the sling tight enough to treat your leakage but not too tight so that you have long-term difficulty emptying your bladder. A mesh sling will not directly treat overactive bladder/urge incontinence and may worsen it.  There is an FDA safety notification on vaginal mesh procedures for prolapse but NOT mesh slings. We have extensive experience and training with mesh placement and we have close postoperative follow up to identify any potential complications from mesh. It is important to realize that this mesh is a permanent implant that cannot be easily removed. There are rare risks of mesh exposure (2-4%), pain with intercourse (0-7%), and infection (<1%). The risk of mesh exposure if more likely in a woman with risks for poor healing (prior radiation, poorly controlled diabetes, or immunocompromised). The risk of new or worsened chronic pain after mesh implant is more common in women with baseline chronic pain and/or poorly controlled anxiety or depression. Approximately 2-4% of patients will experience longer-term post-operative voiding dysfunction that may require surgical revision of the  sling. We also reviewed that postoperatively, her stream may not be as strong as before surgery.   We will consider your anti-incontinence options once we can improve your bladder emptying.

## 2023-07-18 ENCOUNTER — Ambulatory Visit (INDEPENDENT_AMBULATORY_CARE_PROVIDER_SITE_OTHER)

## 2023-07-18 ENCOUNTER — Other Ambulatory Visit (INDEPENDENT_AMBULATORY_CARE_PROVIDER_SITE_OTHER): Payer: Self-pay

## 2023-07-18 VITALS — BP 108/70 | HR 97

## 2023-07-18 DIAGNOSIS — R339 Retention of urine, unspecified: Secondary | ICD-10-CM

## 2023-07-18 LAB — POCT URINALYSIS DIP (CLINITEK)
Bilirubin, UA: NEGATIVE
Blood, UA: NEGATIVE
Glucose, UA: 500 mg/dL — AB
Leukocytes, UA: NEGATIVE
Nitrite, UA: NEGATIVE
POC PROTEIN,UA: NEGATIVE
Spec Grav, UA: 1.015 (ref 1.010–1.025)
Urobilinogen, UA: 0.2 U/dL
pH, UA: 5.5 (ref 5.0–8.0)

## 2023-07-18 NOTE — Patient Instructions (Signed)
 Please follow up as scheduled.  It was a pleasure to see you today!  Thank you for trusting me with your care!

## 2023-07-18 NOTE — Progress Notes (Signed)
 Susan Stafford presents today for a voiding trial.  Patient was identified with 2 identifiers.  The patient states she does not have any concerns with the PESSARY placed. The gell-horn in place has slipped a few times, but not since last Friday. Patient voided. Then we placed a catheter and  200ccs was drained from the bladder. We then filled her bladder with 500cc of normal saline. The catheter was removed and patient was instructed to void into the urinary hat.  She voided 500 mL.  The post void residual measured by bladder scan was 3 mL.  She did pass the voiding trial.   The patient received aftercare instructions and will follow up as scheduled.

## 2023-07-24 ENCOUNTER — Ambulatory Visit: Admitting: Obstetrics

## 2023-07-24 ENCOUNTER — Other Ambulatory Visit (HOSPITAL_COMMUNITY)
Admission: RE | Admit: 2023-07-24 | Discharge: 2023-07-24 | Disposition: A | Discharge status: Home / Self Care | Source: Ambulatory Visit | Attending: Obstetrics | Admitting: Obstetrics

## 2023-07-24 ENCOUNTER — Encounter: Payer: Self-pay | Admitting: Obstetrics

## 2023-07-24 VITALS — BP 107/78 | HR 105

## 2023-07-24 DIAGNOSIS — E119 Type 2 diabetes mellitus without complications: Secondary | ICD-10-CM | POA: Diagnosis not present

## 2023-07-24 DIAGNOSIS — N840 Polyp of corpus uteri: Secondary | ICD-10-CM | POA: Diagnosis not present

## 2023-07-24 DIAGNOSIS — R9389 Abnormal findings on diagnostic imaging of other specified body structures: Secondary | ICD-10-CM | POA: Insufficient documentation

## 2023-07-24 DIAGNOSIS — Z960 Presence of urogenital implants: Secondary | ICD-10-CM

## 2023-07-24 DIAGNOSIS — N393 Stress incontinence (female) (male): Secondary | ICD-10-CM

## 2023-07-24 DIAGNOSIS — N8110 Cystocele, unspecified: Secondary | ICD-10-CM

## 2023-07-24 DIAGNOSIS — R339 Retention of urine, unspecified: Secondary | ICD-10-CM

## 2023-07-24 NOTE — Assessment & Plan Note (Signed)
-   T2DM with neuropathy, HbA1C  6.6 in 04/2023 at goal < 8 preop - pending repeat follow-up with Dr. Jesse Moritz in July, reports repeat HbA1C every 3 months - discussed risk of transient hyperglycemia postop that may require adjustment of medications

## 2023-07-24 NOTE — Assessment & Plan Note (Signed)
-   TVUS 06/02/23 with 5mm endometrial stripe - reviewed risks and benefits of endometrial biopsy - pending endometrial biopsy

## 2023-07-24 NOTE — Progress Notes (Signed)
 [facility] Return Visit  SUBJECTIVE  History of Present Illness: Susan Stafford is a 68 y.o. female seen in follow-up for stage III pelvic organ prolapse, vaginal itching, incomplete bladder emptying, and T2DM. Plan at last visit was continue endometrial biopsy, vaginal estrogen, and trial of size 5 short stem gellhorn pessary placement to reassess bladder emptying.   Urodynamic Impression 07/03/23:  1. Sensation was reduced; capacity was normal   2. Stress Incontinence was demonstrated at normal pressures; 3. Detrusor Overactivity was demonstrated without leakage. 4. Emptying was dysfunctional with a elevated PVR ( ), a sustained detrusor contraction present,  abdominal straining not present, dyssynergic urethral sphincter activity on EMG.  Size 4 short stem gellhorn pessary came out day of placement after squats during workout. Reports active lifestyle with water aerobics and squats Vaginal bulge sensation improved with size 5 long stem gellhorn pessary in place after 3 days of use with improved urine stream during void. Office visit on 6/4 showed PVR by catheterization with size 5 short stem gellhorn pessary, retrograde filled void trial with void.  Pending 10 day cruise to [place]  this Friday. Tried pelvic floor exercises, pelvic floor PT. Desires surgical intervention.  Cares for her grandson 3x/week.  Reports walking daily.  Cr 0.68 on 01/10/23  TVUS 06/02/23 "EXAM: PELVIC ULTRASOUND   TECHNIQUE: Transvaginal pelvic duplex ultrasound using B-mode/gray scaled imaging.   COMPARISON: None provided   CLINICAL HISTORY: History of postmenopausal bleeding.   FINDINGS:   UTERUS: Uterus measures 6.8x3.9x5.1 cm (calculated volume 70.5 ml). 1.7x1.9x2.1 cm intramural right fundal fibroid.   ENDOMETRIAL STRIPE: Endometrial stripe measures 5 mm, within normal limits   RIGHT OVARY: Not discretely visualized. No adnexal mass is seen.    LEFT OVARY: Not discretely visualized. No adnexal mass is seen.   FREE FLUID: No evidence of free fluid.   IMPRESSION: 1. Endometrial stripe measures 5 mm, within normal limits. 2. 2.1 cm intramural right fundal fibroid.   Electronically signed by: Susan Herter MD 06/02/2023 02:37 AM EDT RP Workstation: [id number]  Past Medical History: Patient  has a past medical history of Allergy, Arthritis, Diabetes mellitus without complication (HCC), FH: colon polyps, GERD (gastroesophageal reflux disease), Hyperlipidemia, and Neuromuscular disorder (HCC).   Past Surgical History: She  has a past surgical history that includes Knee surgery (Bilateral).   Medications: She has a current medication list which includes the following prescription(s): black pepper-turmeric, calcipotriene, diclofenac sodium, esomeprazole, fluvastatin, multivitamin, onetouch delica lancets 33g, synjardy, telmisartan, mounjaro, cyanocobalamin, and vitamin d.   Allergies: Patient is allergic to atorvastatin, celecoxib, rosuvastatin, semaglutide, simvastatin, and sulfamethoxazole-trimethoprim.   Social History: Patient  reports that she has never smoked. She has never used smokeless tobacco. She reports current alcohol use. She reports that she does not use drugs.     OBJECTIVE     Physical Exam: Vitals:   07/24/23 1430  BP: 107/78  Pulse: (!) 105    Physical Exam Constitutional:      General: She is not in acute distress.    Appearance: Normal appearance.  Genitourinary:     Urethral meatus normal.     Genitourinary Comments: A size 5 lshort stem gellhorn pessary was in place, stem oriented towards posterior vaginal wall with suction removed with minimal difficulty.      Right Labia: No rash, tenderness, lesions, skin changes or Bartholin's cyst.    Left Labia: No tenderness, lesions, skin changes, Bartholin's cyst or rash.    Vaginal erythema (posterior vaginal wall from  stem of gellhorn pessary)  present.     No vaginal discharge, tenderness, bleeding, ulceration or granulation tissue.     Anterior and apical vaginal prolapse present.       Urethral meatus caruncle not present. Cardiovascular:     Rate and Rhythm: Tachycardia present.  Pulmonary:     Effort: Pulmonary effort is normal. No respiratory distress.  Neurological:     Mental Status: She is alert.  Vitals reviewed. Exam conducted with a chaperone present.   Endometrial Biopsy: After informed consent was obtained and allergies confirmed, speculum was placed and cervix prepped with betadine. Patient was provided 1 dose of Alleve by mouth. Pipelle was passed without resistance and sounded to 8cm. A total of 2 passes were made with a small amount of tissue returned. Patient tolerated the procedure with some cramping. No bleeding was noted from the cervical os. Tissue sent to pathology.      ASSESSMENT AND PLAN    [name] is a 68 y.o. with:  1. Thickened endometrium [R93.89]   2. Incomplete bladder emptying   3. Pelvic organ prolapse quantification stage 3 cystocele   4. SUI (stress urinary incontinence, female)   5. Presence of pessary   6. Type 2 diabetes mellitus without complication, without long-term current use of insulin (HCC)      Thickened endometrium [R93.89] Assessment & Plan: - TVUS 06/02/23 with 5mm endometrial stripe - reviewed risks and benefits of endometrial biopsy - pending endometrial biopsy   Orders: -     Surgical pathology -     Ambulatory Referral For Surgery Scheduling  Incomplete bladder emptying Assessment & Plan: - 07/03/23 POCT + glucose only - sensation of incomplete emptying, difficulty with initiation of voids with via catheterization with repeat at today - repeat 3mL with retrograde void trial with size 5 short stem gellhorn pessary in place - discussed multiple underlying etiology such as outlet obstruction from prolapse vs. DM associated peripheral  neuropathy - encouraged to continue tight glycemic control  - UDS showed SUI, OAB with pelvic floor dyssynergia and elevated PVR - encouraged splinting to ensure bladder emptying and consider replacement of size 5 short stem gellhorn pessary if sensation returns after pessary removal - failed size 4 short stem gellhorn pessary - discussed increased risk of prolonged urinary retention postop - Cr 0.68 on 01/10/23 - consider renal ultrasound if persistently elevated PVR  Orders: -     Ambulatory Referral For Surgery Scheduling  Pelvic organ prolapse quantification stage 3 cystocele Assessment & Plan: - cherry tomato size vaginal bulge - tried self directed pelvic floor exercises, pelvic floor PT - prior evaluation in 2022 - failed size 4 short stem gellhorn pessary, trial of size 5 short stem gellhorn pessary with reduction of prolapse and subjective improvement of bladder emptying and PVR 3mL with retrograde fill void trial - For treatment of pelvic organ prolapse, we discussed options for management including expectant management, conservative management, and surgical management, such as Kegels, a pessary, pelvic floor physical therapy, and specific surgical procedures. - pt desires surgical intervention, sexually active We discussed two options for prolapse repair:  1) Prolapse (with or without mesh): Risk factors for surgical failure  include things that put pressure on your pelvis and the surgical repair, including obesity, chronic cough, and heavy lifting or straining (including lifting children or adults, straining on the toilet, or lifting heavy objects such as furniture or anything weighing >25 lbs. Risks of recurrence is 20-30% with vaginal  native tissue repair and a less than 10% with sacrocolpopexy with mesh.    2)  Sacrocolpopexy: Mesh implants may provide more prolapse support, but do have some unique risks to consider. It is important to understand that mesh is permanent  and cannot be easily removed. Risks of abdominal sacrocolpopexy mesh include mesh exposure (~3-6%), painful intercourse (recent studies show lower rates after surgery compared to before, with ~5-8% risk of new onset), and very rare risks of bowel or bladder injury or infection (<1%). The risk of mesh exposure is more likely in a woman with risks for poor healing (prior radiation, poorly controlled diabetes, or immunocompromised). The risk of new or worsened chronic pain after mesh implant is more common in women with baseline chronic pain and/or poorly controlled anxiety or depression. There is an FDA safety notification on vaginal mesh procedures for prolapse but NOT abdominal mesh procedures and therefore does not apply to your surgery. We have extensive experience and training with mesh placement and we have close postoperative follow up to identify any potential complications from mesh.   - patient desires to proceed with vaginal hysterectomy, BSO, uterosacral ligament suspension, anterior/posterior repair, perineorrhaphy - desires surgery around 09/2023  Orders: -     Ambulatory Referral For Surgery Scheduling  SUI (stress urinary incontinence, female) Assessment & Plan: - denies symptoms, occult SUI noted on urodynamic testing - For treatment of stress urinary incontinence,  non-surgical options include expectant management, weight loss, physical therapy, as well as a pessary.  Surgical options include a midurethral sling, Burch urethropexy, and transurethral injection of a bulking agent. - discussed need to show resolution of incomplete emptying due to increased risk of urinary retention with anti-incontinence at the time of prolapse surgery.  - discussed office procedure with urethral bulking (Bulkamid). We discussed success rate of approximately 70-80% and possible need for second injection. We reviewed that this is not a permanent procedure and the Bulkamid does dissolve over time. Risks  reviewed including injury to bladder or urethra, UTI, urinary retention and hematuria.  - Sling: The effectiveness of a midurethral vaginal mesh sling is approximately 85%, and thus, there will be times when you may leak urine after surgery, especially if your bladder is full or if you have a strong cough. There is a balance between making the sling tight enough to treat your leakage but not too tight so that you have long-term difficulty emptying your bladder. A mesh sling will not directly treat overactive bladder/urge incontinence and may worsen it.  There is an FDA safety notification on vaginal mesh procedures for prolapse but NOT mesh slings. We have extensive experience and training with mesh placement and we have close postoperative follow up to identify any potential complications from mesh. It is important to realize that this mesh is a permanent implant that cannot be easily removed. There are rare risks of mesh exposure (2-4%), pain with intercourse (0-7%), and infection (<1%). The risk of mesh exposure if more likely in a woman with risks for poor healing (prior radiation, poorly controlled diabetes, or immunocompromised). The risk of new or worsened chronic pain after mesh implant is more common in women with baseline chronic pain and/or poorly controlled anxiety or depression. Approximately 2-4% of patients will experience longer-term post-operative voiding dysfunction that may require surgical revision of the sling. We also reviewed that postoperatively, her stream may not be as strong as before surgery.  - elevated PVR , repeat with during UDS - repeat PVR with size  5 short stem gellhorn pessary with PVR 3mL on retrograde void trial.  - discussed increased risk of prolonged urinary retention with anti-incontinence procedure at the time of prolapse repair - pt expresses understanding, desires to proceed with midurethral sling  Orders: -     Ambulatory Referral For Surgery  Scheduling  Presence of pessary Assessment & Plan: - discussed risk of change in urinary or bowel symptoms, vaginal ulceration, discharge, bleeding, fistula formation. Explained that pt may require multiple sizes and types for fitting.  - failed size 4 short stem gellhorn pessary  - size 5 short stem gellhorn pessary with support of vaginal bulge and improved bladder emptying on 07/18/23 with retrograde filled void trial - removed due to endometrial biopsy, encouraged pt to monitor urinary symptoms and consider return for pessary replacement after [place] cruise if sensation of incomplete emptying returns after pessary removal.  Orders: -     Ambulatory Referral For Surgery Scheduling  Type 2 diabetes mellitus without complication, without long-term current use of insulin (HCC) Assessment & Plan: - T2DM with neuropathy, HbA1C  6.6 in 04/2023 at goal < 8 preop - pending repeat follow-up with Dr. Jesse Moritz in July, reports repeat HbA1C every 3 months - discussed risk of transient hyperglycemia postop that may require adjustment of medications  Orders: -     Ambulatory Referral For Surgery Scheduling  Plan for surgery: Exam under anesthesia, TVH, BSO, uterosacral ligament suspension, anterior/posterior repair, perineorrhaphy, midurethral sling, cystourethroscopy  - We reviewed the patient's specific anatomic and functional findings, with the assistance of diagrams, and together finalized the above procedure. The planned surgical procedures were discussed along with the surgical risks outlined below, which were also provided on a detailed handout. Additional treatment options including expectant management, conservative management, medical management were discussed where appropriate.  We reviewed the benefits and risks of each treatment option.   - For preop Visit:  She is required to have a visit within 30 days of her surgery.   Today we reviewed pre-operative preparation, peri-operative  expectations, and post-operative instructions/recovery.  She was provided with instructional handouts. She understands not to take aspirin (>81mg ) or NSAIDs 7 days prior to surgery. Prescriptions will be provided for: Oxycodone 5mg , Tylenol 500mg , Miralax. These prescriptions will be sent prior to surgery.  - Medical clearance: required Letter sent to Dr. Jesse Moritz requesting risk stratification and medical optimization due to tachycardia and T2DM. - Anticoagulant use: Yes, Caprini score 4 - Medicaid Hysterectomy form: No - Accepts blood transfusion: Yes - Expected length of stay: outpatient  Request sent for surgery scheduling.   Time spent: I spent 50 minutes dedicated to the care of this patient on the date of this encounter to include pre-visit review of records, face-to-face time with the patient discussing stage III pelvic organ prolapse, SUI, incomplete bladder emptying, pessary management, and post visit documentation outside of endometrial biopsy.   Darlene Ehlers, MD

## 2023-07-24 NOTE — Progress Notes (Deleted)
 Voided after retrograde fill.  Pessary in place.

## 2023-07-24 NOTE — Assessment & Plan Note (Signed)
-   denies symptoms, occult SUI noted on urodynamic testing - For treatment of stress urinary incontinence,  non-surgical options include expectant management, weight loss, physical therapy, as well as a pessary.  Surgical options include a midurethral sling, Burch urethropexy, and transurethral injection of a bulking agent. - discussed need to show resolution of incomplete emptying due to increased risk of urinary retention with anti-incontinence at the time of prolapse surgery.  - discussed office procedure with urethral bulking (Bulkamid). We discussed success rate of approximately 70-80% and possible need for second injection. We reviewed that this is not a permanent procedure and the Bulkamid does dissolve over time. Risks reviewed including injury to bladder or urethra, UTI, urinary retention and hematuria.  - Sling: The effectiveness of a midurethral vaginal mesh sling is approximately 85%, and thus, there will be times when you may leak urine after surgery, especially if your bladder is full or if you have a strong cough. There is a balance between making the sling tight enough to treat your leakage but not too tight so that you have long-term difficulty emptying your bladder. A mesh sling will not directly treat overactive bladder/urge incontinence and may worsen it.  There is an FDA safety notification on vaginal mesh procedures for prolapse but NOT mesh slings. We have extensive experience and training with mesh placement and we have close postoperative follow up to identify any potential complications from mesh. It is important to realize that this mesh is a permanent implant that cannot be easily removed. There are rare risks of mesh exposure (2-4%), pain with intercourse (0-7%), and infection (<1%). The risk of mesh exposure if more likely in a woman with risks for poor healing (prior radiation, poorly controlled diabetes, or immunocompromised). The risk of new or worsened chronic pain after mesh  implant is more common in women with baseline chronic pain and/or poorly controlled anxiety or depression. Approximately 2-4% of patients will experience longer-term post-operative voiding dysfunction that may require surgical revision of the sling. We also reviewed that postoperatively, her stream may not be as strong as before surgery.  - elevated PVR , repeat with during UDS - repeat PVR with size 5 short stem gellhorn pessary with PVR 3mL on retrograde void trial.  - discussed increased risk of prolonged urinary retention with anti-incontinence procedure at the time of prolapse repair - pt expresses understanding, desires to proceed with midurethral sling

## 2023-07-24 NOTE — Patient Instructions (Signed)
 POST PROCEDURE INSTRUCTIONS  Taking your medications Please take 500mg  acetaminophen every 6 hours as needed for pain.  Reasons to Call the Nurse (see last page for phone numbers) Heavy Bleeding (changing your pad every 1-2 hours) Persistent nausea/vomiting Fever (100.4 degrees or more) Incision problems (pus or other fluid coming out, redness, warmth, increased pain)  We will call you regarding your pre-operative appointment.   Please splint (gently push your vaginal bulge into the vagina) to ensure bladder emptying.

## 2023-07-24 NOTE — Assessment & Plan Note (Signed)
-   07/03/23 POCT + glucose only - sensation of incomplete emptying, difficulty with initiation of voids with via catheterization with repeat at today - repeat 3mL with retrograde void trial with size 5 short stem gellhorn pessary in place - discussed multiple underlying etiology such as outlet obstruction from prolapse vs. DM associated peripheral neuropathy - encouraged to continue tight glycemic control  - UDS showed SUI, OAB with pelvic floor dyssynergia and elevated PVR - encouraged splinting to ensure bladder emptying and consider replacement of size 5 short stem gellhorn pessary if sensation returns after pessary removal - failed size 4 short stem gellhorn pessary - discussed increased risk of prolonged urinary retention postop - Cr 0.68 on 01/10/23 - consider renal ultrasound if persistently elevated PVR

## 2023-07-24 NOTE — Assessment & Plan Note (Addendum)
-   cherry tomato size vaginal bulge - tried self directed pelvic floor exercises, pelvic floor PT - prior evaluation in 2022 - failed size 4 short stem gellhorn pessary, trial of size 5 short stem gellhorn pessary with reduction of prolapse and subjective improvement of bladder emptying and PVR 3mL with retrograde fill void trial - For treatment of pelvic organ prolapse, we discussed options for management including expectant management, conservative management, and surgical management, such as Kegels, a pessary, pelvic floor physical therapy, and specific surgical procedures. - pt desires surgical intervention, sexually active We discussed two options for prolapse repair:  1) Prolapse (with or without mesh): Risk factors for surgical failure  include things that put pressure on your pelvis and the surgical repair, including obesity, chronic cough, and heavy lifting or straining (including lifting children or adults, straining on the toilet, or lifting heavy objects such as furniture or anything weighing >25 lbs. Risks of recurrence is 20-30% with vaginal native tissue repair and a less than 10% with sacrocolpopexy with mesh.    2)  Sacrocolpopexy: Mesh implants may provide more prolapse support, but do have some unique risks to consider. It is important to understand that mesh is permanent and cannot be easily removed. Risks of abdominal sacrocolpopexy mesh include mesh exposure (~3-6%), painful intercourse (recent studies show lower rates after surgery compared to before, with ~5-8% risk of new onset), and very rare risks of bowel or bladder injury or infection (<1%). The risk of mesh exposure is more likely in a woman with risks for poor healing (prior radiation, poorly controlled diabetes, or immunocompromised). The risk of new or worsened chronic pain after mesh implant is more common in women with baseline chronic pain and/or poorly controlled anxiety or depression. There is an FDA safety  notification on vaginal mesh procedures for prolapse but NOT abdominal mesh procedures and therefore does not apply to your surgery. We have extensive experience and training with mesh placement and we have close postoperative follow up to identify any potential complications from mesh.   - patient desires to proceed with vaginal hysterectomy, BSO, uterosacral ligament suspension, anterior/posterior repair, perineorrhaphy - desires surgery around 09/2023

## 2023-07-24 NOTE — Assessment & Plan Note (Signed)
-   discussed risk of change in urinary or bowel symptoms, vaginal ulceration, discharge, bleeding, fistula formation. Explained that pt may require multiple sizes and types for fitting.  - failed size 4 short stem gellhorn pessary  - size 5 short stem gellhorn pessary with support of vaginal bulge and improved bladder emptying on 07/18/23 with retrograde filled void trial - removed due to endometrial biopsy, encouraged pt to monitor urinary symptoms and consider return for pessary replacement after [place] cruise if sensation of incomplete emptying returns after pessary removal.

## 2023-07-26 ENCOUNTER — Ambulatory Visit: Payer: Self-pay | Admitting: Obstetrics

## 2023-07-26 LAB — SURGICAL PATHOLOGY

## 2023-07-26 IMAGING — MG MM DIGITAL SCREENING BILAT W/ TOMO AND CAD
8 series · 9 of 24 positions shown · non-contrast
Comparison: Previous exam(s).

CLINICAL DATA: Screening.

EXAM:
DIGITAL SCREENING BILATERAL MAMMOGRAM WITH TOMOSYNTHESIS AND CAD
TECHNIQUE: Bilateral screening digital craniocaudal and mediolateral oblique
mammograms were obtained. Bilateral screening digital breast
tomosynthesis was performed. The images were evaluated with
computer-aided detection.

[R CC synth-2D]
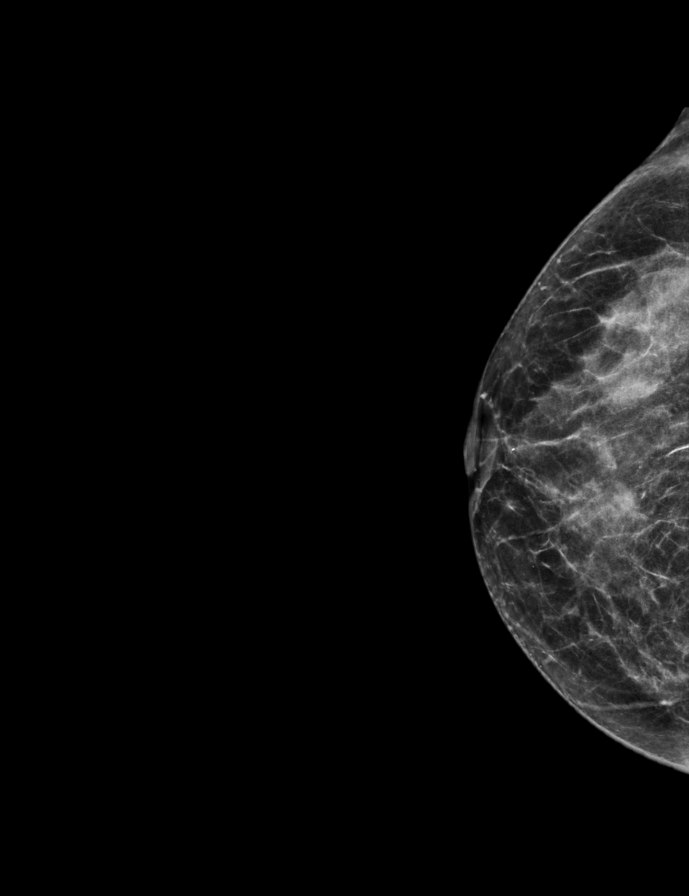

[R MLO synth-2D]
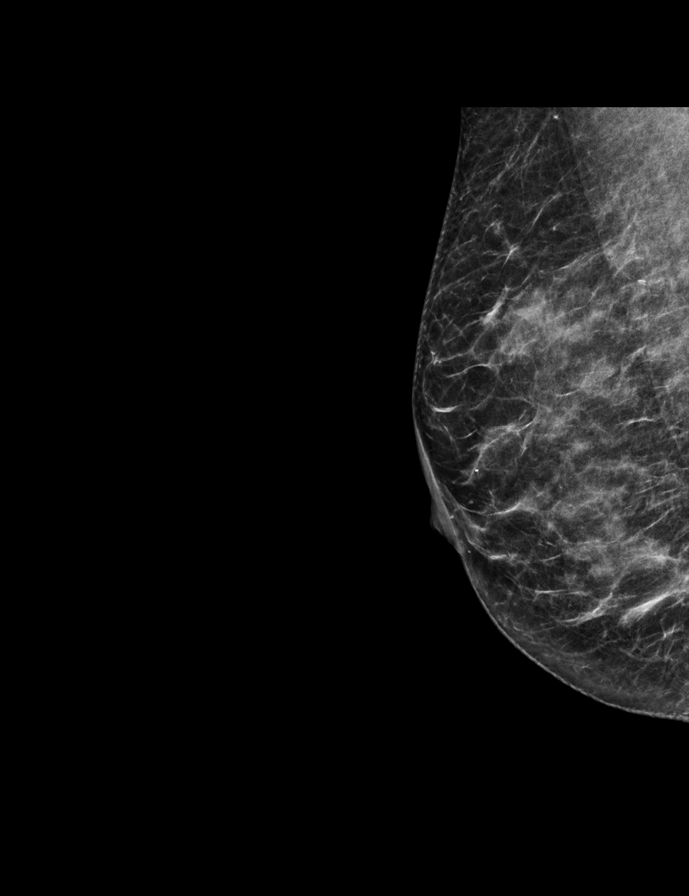

[L CC synth-2D]
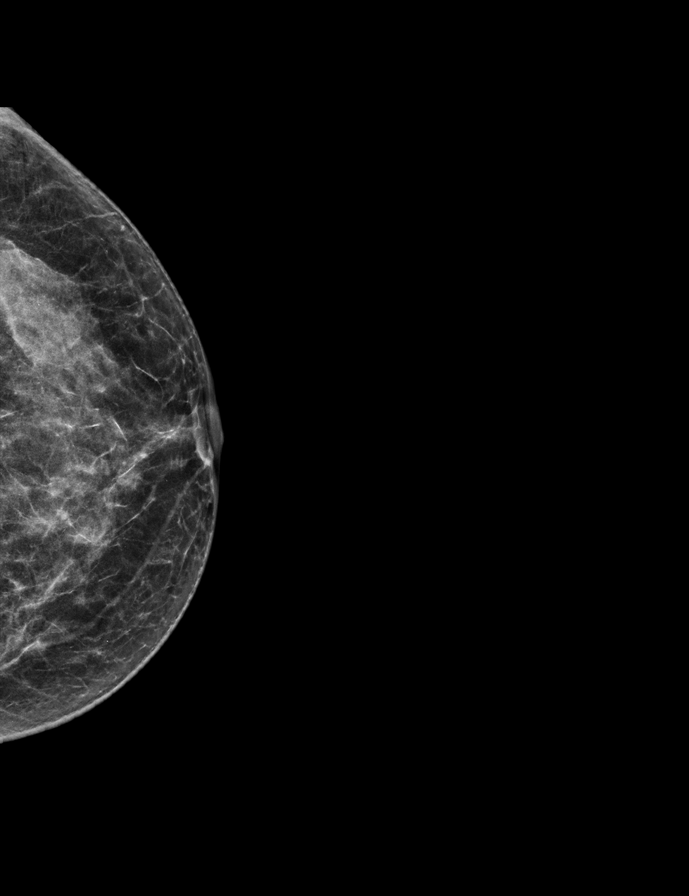

[L MLO synth-2D]
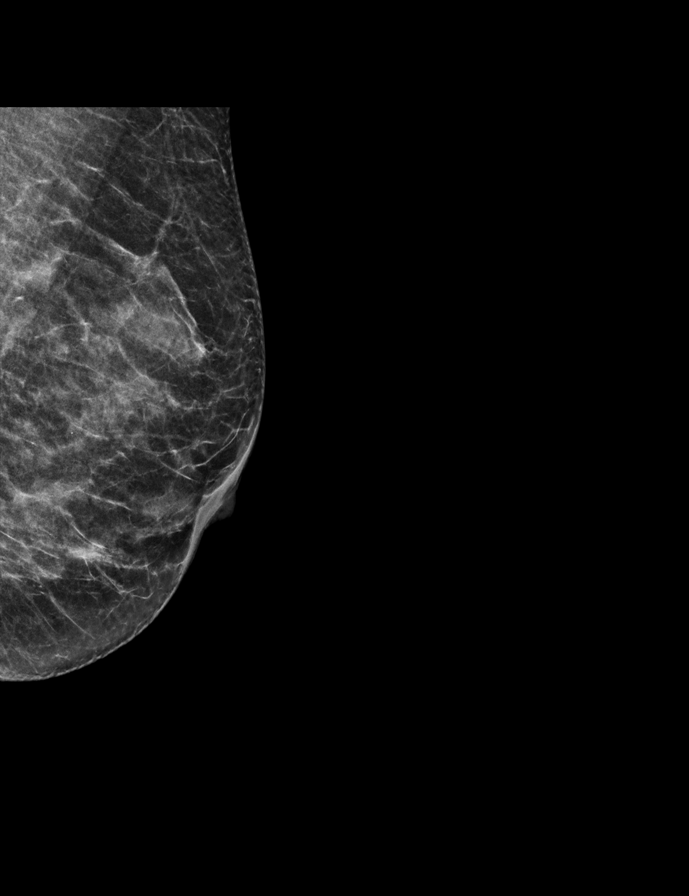

[R CC tomo · 2 of 52 frames shown]
[frame 17/52]
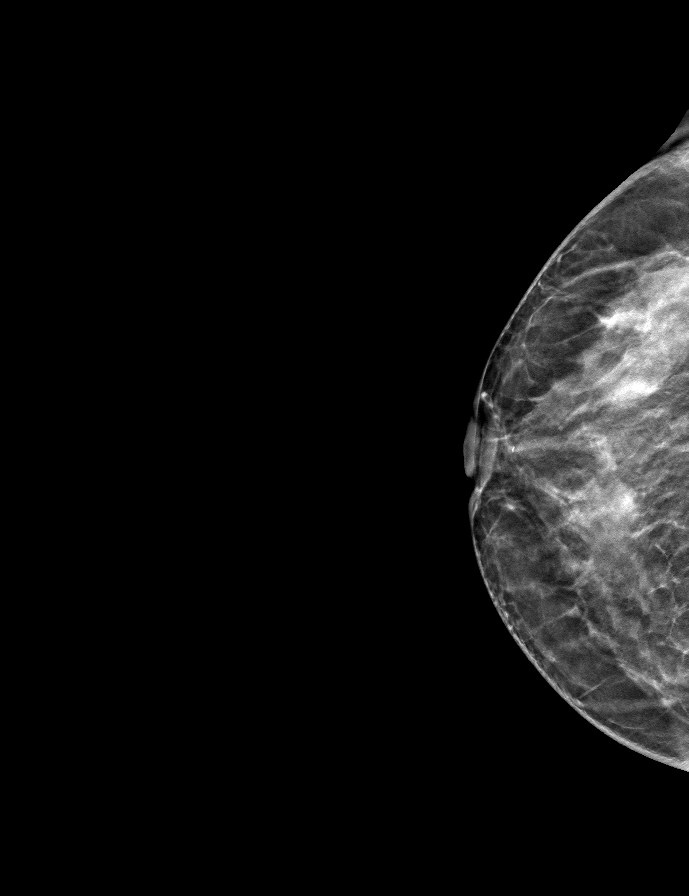
[frame 27/52]
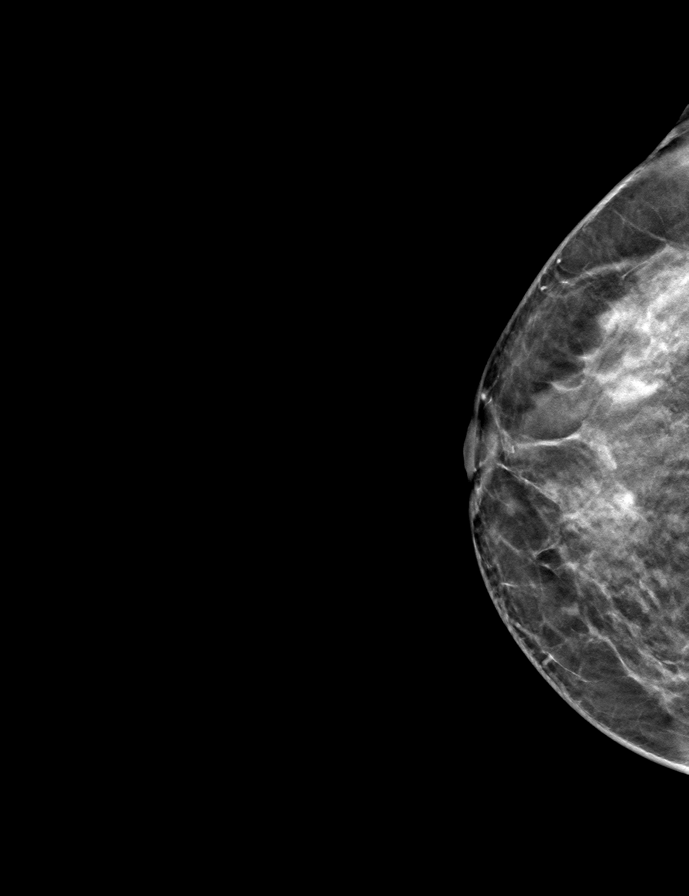

[R MLO tomo · tomo slice 25/50.0]
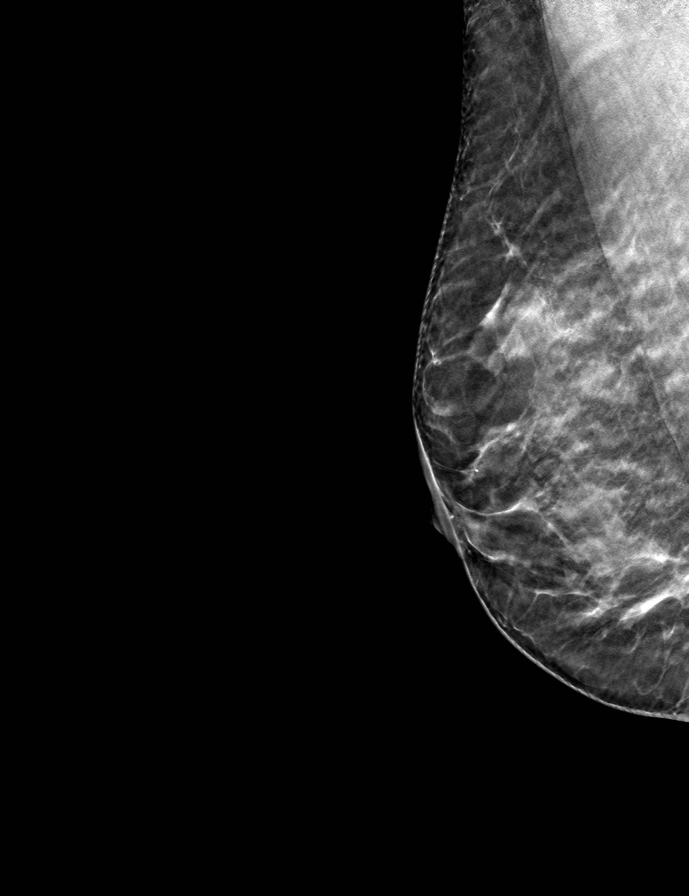

[L MLO tomo · tomo slice 25/49.0]
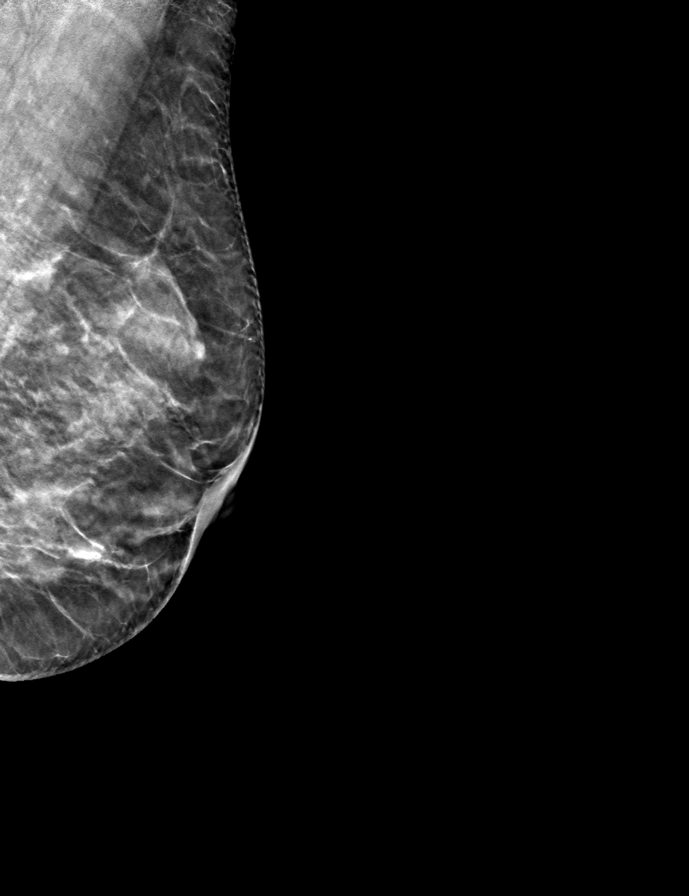

[L CC tomo · tomo slice 25/50.0]
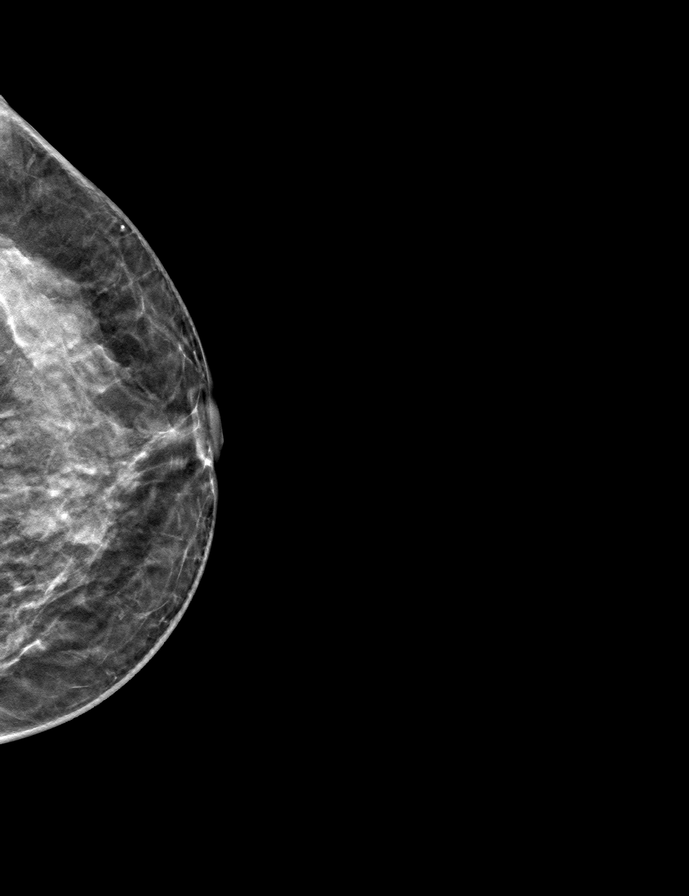

[9 of 24 positions shown; findings below may reference images not displayed]

ACR Breast Density Category c: The breast tissue is heterogeneously
dense, which may obscure small masses.
FINDINGS: There are no findings suspicious for malignancy.
IMPRESSION: No mammographic evidence of malignancy. A result letter of this
screening mammogram will be mailed directly to the patient.

RECOMMENDATION:
Screening mammogram in one year. (Code:Q3-W-BC3)

BI-RADS CATEGORY  1: Negative.

## 2023-09-03 ENCOUNTER — Encounter: Payer: Self-pay | Admitting: *Deleted

## 2023-09-04 NOTE — Telephone Encounter (Signed)
 PT Scheduled and Informed for 9/23 KD

## 2023-09-06 DIAGNOSIS — N814 Uterovaginal prolapse, unspecified: Secondary | ICD-10-CM | POA: Diagnosis not present

## 2023-09-06 DIAGNOSIS — K219 Gastro-esophageal reflux disease without esophagitis: Secondary | ICD-10-CM | POA: Diagnosis not present

## 2023-09-06 DIAGNOSIS — N182 Chronic kidney disease, stage 2 (mild): Secondary | ICD-10-CM | POA: Diagnosis not present

## 2023-09-06 DIAGNOSIS — E1129 Type 2 diabetes mellitus with other diabetic kidney complication: Secondary | ICD-10-CM | POA: Diagnosis not present

## 2023-09-06 DIAGNOSIS — E1142 Type 2 diabetes mellitus with diabetic polyneuropathy: Secondary | ICD-10-CM | POA: Diagnosis not present

## 2023-09-06 DIAGNOSIS — L409 Psoriasis, unspecified: Secondary | ICD-10-CM | POA: Diagnosis not present

## 2023-09-06 DIAGNOSIS — D126 Benign neoplasm of colon, unspecified: Secondary | ICD-10-CM | POA: Diagnosis not present

## 2023-09-06 DIAGNOSIS — M8589 Other specified disorders of bone density and structure, multiple sites: Secondary | ICD-10-CM | POA: Diagnosis not present

## 2023-09-26 DIAGNOSIS — M542 Cervicalgia: Secondary | ICD-10-CM | POA: Diagnosis not present

## 2023-09-26 DIAGNOSIS — R202 Paresthesia of skin: Secondary | ICD-10-CM | POA: Diagnosis not present

## 2023-10-23 DIAGNOSIS — R202 Paresthesia of skin: Secondary | ICD-10-CM | POA: Diagnosis not present

## 2023-10-23 DIAGNOSIS — M542 Cervicalgia: Secondary | ICD-10-CM | POA: Diagnosis not present

## 2023-10-25 ENCOUNTER — Ambulatory Visit: Admitting: Obstetrics and Gynecology

## 2023-10-25 ENCOUNTER — Encounter: Payer: Self-pay | Admitting: Obstetrics and Gynecology

## 2023-10-25 VITALS — BP 109/71 | HR 89 | Ht 62.5 in | Wt 122.6 lb

## 2023-10-25 DIAGNOSIS — Z01818 Encounter for other preprocedural examination: Secondary | ICD-10-CM

## 2023-10-25 MED ORDER — IBUPROFEN 600 MG PO TABS
600.0000 mg | ORAL_TABLET | Freq: Four times a day (QID) | ORAL | 0 refills | Status: DC | PRN
Start: 2023-10-25 — End: 2023-12-17

## 2023-10-25 MED ORDER — POLYETHYLENE GLYCOL 3350 17 GM/SCOOP PO POWD
17.0000 g | Freq: Every day | ORAL | 0 refills | Status: AC
Start: 2023-10-25 — End: 2023-10-26

## 2023-10-25 MED ORDER — ACETAMINOPHEN 500 MG PO TABS
500.0000 mg | ORAL_TABLET | Freq: Four times a day (QID) | ORAL | 0 refills | Status: AC | PRN
Start: 2023-10-25 — End: ?

## 2023-10-25 MED ORDER — OXYCODONE HCL 5 MG PO TABS
5.0000 mg | ORAL_TABLET | ORAL | 0 refills | Status: DC | PRN
Start: 2023-10-25 — End: 2023-12-17

## 2023-10-25 MED ORDER — GABAPENTIN 100 MG PO CAPS
100.0000 mg | ORAL_CAPSULE | Freq: Two times a day (BID) | ORAL | 0 refills | Status: DC | PRN
Start: 2023-10-25 — End: 2023-12-17

## 2023-10-25 NOTE — H&P (Signed)
 [facility] H&P  Subjective Chief Complaint: Susan Stafford presents for a preoperative encounter.   History of Present Illness: Susan Stafford is a 68 y.o. female who presents for preoperative visit.  She is scheduled to undergo  Exam under anesthesia, transvaginal hysterectomy, bilateral salpingo-oophorectomy, uterosacral ligament suspension, anterior/posterior repair, perineorrhaphy, midurethral sling, cystourethroscopy  on 11/06/23.  Her symptoms include Pelvic organ prolapse and stress urinary incontinence, and she was was found to have Stage II anterior, Stage I posterior, Stage III apical prolapse.   Urodynamics showed: 1. Sensation was reduced; capacity was normal   2. Stress Incontinence was demonstrated at normal pressures; 3. Detrusor Overactivity was demonstrated without leakage. 4. Emptying was dysfunctional with a elevated PVR ( ), a sustained detrusor contraction present,  abdominal straining not present, dyssynergic urethral sphincter activity on EMG  Past Medical History:  Diagnosis Date   Allergy    seasonal   Arthritis    knees and one hand   Diabetes mellitus without complication (HCC)    FH: colon polyps    sister TA +   GERD (gastroesophageal reflux disease)    Hyperlipidemia    Neuromuscular disorder (HCC)    slight neuropathy feet     Past Surgical History:  Procedure Laterality Date   KNEE SURGERY Bilateral     is allergic to atorvastatin, celecoxib, rosuvastatin, semaglutide, simvastatin, and sulfamethoxazole-trimethoprim.   Family History  Problem Relation Age of Onset   Diabetes type II Mother    Diabetes type II Father    Colon polyps Sister    Colon cancer Neg Hx    Esophageal cancer Neg Hx    Rectal cancer Neg Hx    Stomach cancer Neg Hx    Breast cancer Neg Hx    Bladder Cancer Neg Hx    Uterine cancer Neg Hx    Renal Disease Neg Hx     Social History   Tobacco Use   Smoking status: Never    Smokeless tobacco: Never  Vaping Use   Vaping status: Never Used  Substance Use Topics   Alcohol use: Yes    Comment: wine occasionally   Drug use: Never     Review of Systems was negative for a full 10 system review except as noted in the History of Present Illness.  No current facility-administered medications for this encounter.  Current Outpatient Medications:    acetaminophen  (TYLENOL ) 500 MG tablet, Take 1 tablet (500 mg total) by mouth every 6 (six) hours as needed (pain)., Disp: 30 tablet, Rfl: 0   Black Pepper-Turmeric 3-500 MG CAPS, See admin instructions., Disp: , Rfl:    calcipotriene (DOVONOX) 0.005 % cream, 1 application, Disp: , Rfl:    diclofenac Sodium (VOLTAREN) 1 % GEL, See admin instructions., Disp: , Rfl:    esomeprazole (NEXIUM) 20 MG capsule, 1 capsule, Disp: , Rfl:    fluvastatin (LESCOL) 40 MG capsule, Take 40 mg by mouth at bedtime., Disp: , Rfl:    gabapentin  (NEURONTIN ) 100 MG capsule, Take 1 capsule (100 mg total) by mouth every 12 (twelve) hours as needed., Disp: 15 capsule, Rfl: 0   ibuprofen  (ADVIL ) 600 MG tablet, Take 1 tablet (600 mg total) by mouth every 6 (six) hours as needed., Disp: 30 tablet, Rfl: 0   Multiple Vitamin (MULTIVITAMIN) capsule, Take 1 capsule by mouth daily., Disp: , Rfl:    OneTouch Delica Lancets 33G MISC, See admin instructions., Disp: , Rfl:    oxyCODONE  (OXY IR/ROXICODONE ) 5 MG immediate release  tablet, Take 1 tablet (5 mg total) by mouth every 4 (four) hours as needed for severe pain (pain score 7-10)., Disp: 15 tablet, Rfl: 0   polyethylene glycol powder (GLYCOLAX /MIRALAX ) 17 GM/SCOOP powder, Take 17 g by mouth daily. Drink 17g (1 scoop) dissolved in water per day., Disp: 255 g, Rfl: 0   SYNJARDY 12.5-500 MG TABS, Take by mouth., Disp: , Rfl:    telmisartan (MICARDIS) 20 MG tablet, Take 1 tablet by mouth daily., Disp: , Rfl:    tirzepatide (MOUNJARO) 10 MG/0.5ML Pen, INJECT THE CONTENTS OF ONE PEN UNDER THE SKIN WEEKLY ON THE  SAME DAY EACH WEEK for 28, Disp: , Rfl:    vitamin B-12 (CYANOCOBALAMIN) 100 MCG tablet, , Disp: , Rfl:    VITAMIN D PO, Take by mouth., Disp: , Rfl:    Objective There were no vitals filed for this visit.   Gen: NAD CV: S1 S2 RRR Lungs: Clear to auscultation bilaterally Abd: soft, nontender   Previous Pelvic Exam showed: POP-Q   1                                            Aa   1                                           Ba   2                                              C    6                                            Gh   4                                            Pb   7                                            tvl    -2                                            Ap   -2                                            Bp   -1  Assessment/ Plan  Assessment: The patient is a 68 y.o. year old scheduled to undergo Exam under anesthesia, transvaginal hysterectomy, bilateral salpingo-oophorectomy, uterosacral ligament suspension, anterior/posterior repair, perineorrhaphy, midurethral sling, cystourethroscopy. Verbal consent was obtained for these procedures.

## 2023-10-25 NOTE — Progress Notes (Signed)
 [facility] Pre-Operative Exam  Subjective Chief Complaint: Susan Stafford presents for a preoperative encounter.   History of Present Illness: Susan Stafford is a 68 y.o. female who presents for preoperative visit.  She is scheduled to undergo  Exam under anesthesia, transvaginal hysterectomy, bilateral salpingo-oophorectomy, uterosacral ligament suspension, anterior/posterior repair, perineorrhaphy, midurethral sling, cystourethroscopy  on 11/06/23.  Her symptoms include Pelvic organ prolapse and stress urinary incontinence, and she was was found to have Stage II anterior, Stage I posterior, Stage III apical prolapse.   Urodynamics showed: 1. Sensation was reduced; capacity was normal   2. Stress Incontinence was demonstrated at normal pressures; 3. Detrusor Overactivity was demonstrated without leakage. 4. Emptying was dysfunctional with a elevated PVR ( ), a sustained detrusor contraction present,  abdominal straining not present, dyssynergic urethral sphincter activity on EMG  Past Medical History:  Diagnosis Date   Allergy    seasonal   Arthritis    knees and one hand   Diabetes mellitus without complication (HCC)    FH: colon polyps    sister TA +   GERD (gastroesophageal reflux disease)    Hyperlipidemia    Neuromuscular disorder (HCC)    slight neuropathy feet     Past Surgical History:  Procedure Laterality Date   KNEE SURGERY Bilateral     is allergic to atorvastatin, celecoxib, rosuvastatin, semaglutide, simvastatin, and sulfamethoxazole-trimethoprim.   Family History  Problem Relation Age of Onset   Diabetes type II Mother    Diabetes type II Father    Colon polyps Sister    Colon cancer Neg Hx    Esophageal cancer Neg Hx    Rectal cancer Neg Hx    Stomach cancer Neg Hx    Breast cancer Neg Hx    Bladder Cancer Neg Hx    Uterine cancer Neg Hx    Renal Disease Neg Hx     Social History   Tobacco Use   Smoking  status: Never   Smokeless tobacco: Never  Vaping Use   Vaping status: Never Used  Substance Use Topics   Alcohol use: Yes    Comment: wine occasionally   Drug use: Never     Review of Systems was negative for a full 10 system review except as noted in the History of Present Illness.   Current Outpatient Medications:    acetaminophen  (TYLENOL ) 500 MG tablet, Take 1 tablet (500 mg total) by mouth every 6 (six) hours as needed (pain)., Disp: 30 tablet, Rfl: 0   gabapentin  (NEURONTIN ) 100 MG capsule, Take 1 capsule (100 mg total) by mouth every 12 (twelve) hours as needed., Disp: 15 capsule, Rfl: 0   ibuprofen  (ADVIL ) 600 MG tablet, Take 1 tablet (600 mg total) by mouth every 6 (six) hours as needed., Disp: 30 tablet, Rfl: 0   oxyCODONE  (OXY IR/ROXICODONE ) 5 MG immediate release tablet, Take 1 tablet (5 mg total) by mouth every 4 (four) hours as needed for severe pain (pain score 7-10)., Disp: 15 tablet, Rfl: 0   polyethylene glycol powder (GLYCOLAX /MIRALAX ) 17 GM/SCOOP powder, Take 17 g by mouth daily. Drink 17g (1 scoop) dissolved in water per day., Disp: 255 g, Rfl: 0   Black Pepper-Turmeric 3-500 MG CAPS, See admin instructions., Disp: , Rfl:    calcipotriene (DOVONOX) 0.005 % cream, 1 application, Disp: , Rfl:    diclofenac Sodium (VOLTAREN) 1 % GEL, See admin instructions., Disp: , Rfl:    esomeprazole (NEXIUM) 20 MG capsule, 1 capsule, Disp: , Rfl:  fluvastatin (LESCOL) 40 MG capsule, Take 40 mg by mouth at bedtime., Disp: , Rfl:    Multiple Vitamin (MULTIVITAMIN) capsule, Take 1 capsule by mouth daily., Disp: , Rfl:    OneTouch Delica Lancets 33G MISC, See admin instructions., Disp: , Rfl:    SYNJARDY 12.5-500 MG TABS, Take by mouth., Disp: , Rfl:    telmisartan (MICARDIS) 20 MG tablet, Take 1 tablet by mouth daily., Disp: , Rfl:    tirzepatide (MOUNJARO) 10 MG/0.5ML Pen, INJECT THE CONTENTS OF ONE PEN UNDER THE SKIN WEEKLY ON THE SAME DAY EACH WEEK for 28, Disp: , Rfl:    vitamin  B-12 (CYANOCOBALAMIN) 100 MCG tablet, , Disp: , Rfl:    VITAMIN D PO, Take by mouth., Disp: , Rfl:    Objective Vitals:   10/25/23 1626  BP: 109/71  Pulse: 89    Gen: NAD CV: S1 S2 RRR Lungs: Clear to auscultation bilaterally Abd: soft, nontender   Previous Pelvic Exam showed: POP-Q   1                                            Aa   1                                           Ba   2                                              C    6                                            Gh   4                                            Pb   7                                            tvl    -2                                            Ap   -2                                            Bp   -1  Assessment/ Plan  Assessment: The patient is a 68 y.o. year old scheduled to undergo Exam under anesthesia, transvaginal hysterectomy, bilateral salpingo-oophorectomy, uterosacral ligament suspension, anterior/posterior repair, perineorrhaphy, midurethral sling, cystourethroscopy. Verbal consent was obtained for these procedures.  Plan: General Surgical Consent: The patient has previously been counseled on alternative treatments, and the decision by the patient and provider was to proceed with the procedure listed above.  For all procedures, there are risks of bleeding, infection, damage to surrounding organs including but not limited to bowel, bladder, blood vessels, ureters and nerves, and need for further surgery if an injury were to occur. These risks are all low with minimally invasive surgery.   There are risks of numbness and weakness at any body site or buttock/rectal pain.  It is possible that baseline pain can be worsened by surgery, either with or without mesh. If surgery is vaginal, there is also a low risk of possible conversion to laparoscopy or open abdominal incision where indicated. Very rare risks include blood  transfusion, blood clot, heart attack, pneumonia, or death.   There is also a risk of short-term postoperative urinary retention with need to use a catheter. About half of patients need to go home from surgery with a catheter, which is then later removed in the office. The risk of long-term need for a catheter is very low. There is also a risk of worsening of overactive bladder.   Sling: The effectiveness of a midurethral vaginal mesh sling is approximately 85%, and thus, there will be times when you may leak urine after surgery, especially if your bladder is full or if you have a strong cough. There is a balance between making the sling tight enough to treat your leakage but not too tight so that you have long-term difficulty emptying your bladder. A mesh sling will not directly treat overactive bladder/urge incontinence and may worsen it.  There is an FDA safety notification on vaginal mesh procedures for prolapse but NOT mesh slings. We have extensive experience and training with mesh placement and we have close postoperative follow up to identify any potential complications from mesh. It is important to realize that this mesh is a permanent implant that cannot be easily removed. There are rare risks of mesh exposure (2-4%), pain with intercourse (0-7%), and infection (<1%). The risk of mesh exposure if more likely in a woman with risks for poor healing (prior radiation, poorly controlled diabetes, or immunocompromised). The risk of new or worsened chronic pain after mesh implant is more common in women with baseline chronic pain and/or poorly controlled anxiety or depression. Approximately 2-4% of patients will experience longer-term post-operative voiding dysfunction that may require surgical revision of the sling. We also reviewed that postoperatively, her stream may not be as strong as before surgery.    Prolapse (with or without mesh): Risk factors for surgical failure  include things that put  pressure on your pelvis and the surgical repair, including obesity, chronic cough, and heavy lifting or straining (including lifting children or adults, straining on the toilet, or lifting heavy objects such as furniture or anything weighing >25 lbs. Risks of recurrence is 20-30% with vaginal native tissue repair and a less than 10% with sacrocolpopexy with mesh.      We discussed consent for blood products. Risks for blood transfusion include allergic reactions, other reactions that can affect different body organs and managed accordingly, transmission of infectious diseases such as HIV or Hepatitis. However, the blood is screened. Patient consents for blood  products.  Pre-operative instructions:  She was instructed to not take Aspirin/NSAIDs x 7days prior to surgery. She will hold her Turmeric. Antibiotic prophylaxis was ordered as indicated.  Catheter use: Patient will go home with foley if needed after post-operative voiding trial.  Post-operative instructions:  She was provided with specific post-operative instructions, including precautions and signs/symptoms for which we would recommend contacting us , in addition to daytime and after-hours contact phone numbers. This was provided on a handout.   Post-operative medications: Prescriptions for motrin , tylenol , miralax , gabapentin  and oxycodone  were sent to her pharmacy. Discussed using ibuprofen  and tylenol  on a schedule to limit use of narcotics.   Laboratory testing:  We will check labs: CBC and Type and screen  Preoperative clearance:  She does require surgical clearance.  Dr. Nichole sent message to for pre-op clearance.   Post-operative follow-up:  A post-operative appointment will be made for 6 weeks from the date of surgery. If she needs a post-operative nurse visit for a voiding trial, that will be set up after she leaves the hospital.    Patient will call the clinic or use MyChart should anything change or any new issues  arise.   Julie Nay G Eilah Common, NP

## 2023-10-29 DIAGNOSIS — R21 Rash and other nonspecific skin eruption: Secondary | ICD-10-CM | POA: Diagnosis not present

## 2023-10-29 DIAGNOSIS — E782 Mixed hyperlipidemia: Secondary | ICD-10-CM | POA: Diagnosis not present

## 2023-10-29 DIAGNOSIS — L409 Psoriasis, unspecified: Secondary | ICD-10-CM | POA: Diagnosis not present

## 2023-10-30 ENCOUNTER — Encounter (HOSPITAL_COMMUNITY): Payer: Self-pay | Admitting: Obstetrics

## 2023-10-30 NOTE — Pre-Procedure Instructions (Signed)
 Surgical Instructions   Your procedure is scheduled on :  Tuesday,  11-06-2023. Report to [facility] Main Entrance A at 10:00  A.M., then check in with the Admitting office. Any questions or running late day of surgery: call [phone]  Questions prior to your surgery date: call [phone], Monday-Friday, 8am-4pm. If you experience any cold or flu symptoms such as cough, fever, chills, shortness of breath, etc. between now and your scheduled surgery, please notify surgeon office.    Remember:  Do not eat any food after midnight the night before your surgery.  You may have clear liquids from midnight night before surgery until 9:00 AM.  Clear liquids allowed are:  Water  Carbonated Beverages (diabetes please choose diet or no sugar options) Clear Tea (no milk, honey, etc.) Black Coffee Only (NO MILK, CREAM OR POWDERED CREAMER of any kind) Sport drinks,  like Gatorade  (diabetes please choose diet or no sugar options)  NO clear liquids after 9:00 AM day of surgery.  This includes no water,  candy,  gum,  and  mints.  DIABETES MEDIATION:    [place] ---  Stop 72 hours prior to surgery;  last dose Friday evening 11-02-2023    Take these medicines the morning of surgery with A SIPS OF WATER : Esomeprazole (nexium)   May take these medicines IF NEEDED:  NONE    One week prior to surgery, STOP taking any Aspirin (unless otherwise instructed by your surgeon) Aleve, Naproxen, Ibuprofen , Motrin , Advil , Goody's, BC's, all herbal medications, fish oil, and non-prescription vitamins.                     Do NOT Smoke (Tobacco/Vaping) and Do Not drink alcohol for 24 hours prior to your procedure.  If you use a CPAP at night, you may bring your mask/headgear for your overnight stay.   You will be asked to remove any contacts, glasses, piercing's, hearing aid's, dentures/partials prior to surgery. Please bring cases / contact solution/ for these items day of surgery.   Patients  discharged the day of surgery will not be allowed to drive home, and someone needs to stay with them for 24 hours.  SURGICAL WAITING ROOM VISITATION Patients may have no more than 2 support people in the waiting area - these visitors may rotate.   Pre-op nurse will coordinate an appropriate time for 1 ADULT support person, who may not rotate, to accompany patient in pre-op.  Children under the age of 63 must have an adult with them who is not the patient and must remain in the main waiting area with an adult.  If the patient needs to stay at the hospital during part of their recovery, the visitor guidelines for inpatient rooms apply.  Please refer to the [facility] website for the visitor guidelines for any additional information.   If you received a COVID test during your pre-op visit  it is requested that you wear a mask when out in public, stay away from anyone that may not be feeling well and notify your surgeon if you develop symptoms. If you have been in contact with anyone that has tested positive in the last 10 days please notify you surgeon.      Pre-operative CHG Bathing Instructions   You can play a key role in reducing the risk of infection after surgery. Your skin needs to be as free of germs as possible. You can reduce the number of germs on your skin by washing  with CHG (chlorhexidine gluconate) soap before surgery. CHG is an antiseptic soap that kills germs and continues to kill germs even after washing.   DO NOT use if you have an allergy to chlorhexidine/CHG or antibacterial soaps. If your skin becomes reddened or irritated, stop using the CHG and notify one of our RN day of surgery.             TAKE A SHOWER THE NIGHT BEFORE SURGERY AND THE DAY OF SURGERY    Please keep in mind the following:  DO NOT shave, including legs and genital area, 48 hours prior to surgery.   You may shave your face before/day of surgery.  Place clean sheets on your bed the night before  surgery Use a clean washcloth (not used since being washed) for each shower. DO NOT sleep with pet's night before surgery.  CHG Shower Instructions:  Wash your face and private area with normal soap. If you choose to wash your hair, wash first with your normal shampoo.  After you use shampoo/soap, rinse your hair and body thoroughly to remove shampoo/soap residue.  Turn the water OFF and apply half the bottle of CHG soap to a CLEAN washcloth.  Apply CHG soap ONLY FROM YOUR NECK DOWN TO YOUR TOES (washing for 3-5 minutes)  DO NOT use CHG soap on face, private areas, open wounds, or sores.  Pay special attention to the area where your surgery is being performed.  If you are having back surgery, having someone wash your back for you may be helpful. Wait 2 minutes after CHG soap is applied, then you may rinse off the CHG soap.  Pat dry with a clean towel  Put on clean pajamas    Additional instructions for the day of surgery: DO NOT APPLY any lotions,  powder,  oils,  deodorants (may use underarm deodorant) , cologne/  perfumes  or makeup Do not wear jewelry /  piercing's/  metal/  permanent jewelry must be removed prior to arrival day of surgery.  (No plastic piercing) Do not wear nail polish, gel polish, artificial nails, or any other type of covering on natural finger nails (toe nails are okay) Do not bring valuables to the hospital. [facility] is not responsible for valuables/personal belongings. Put on clean/comfortable clothes.  Please brush your teeth and rinse mouth out. Ask your nurse before applying any prescription medications to the skin.    HOW TO MANAGE YOUR DIABETES BEFORE AND AFTER SURGERY  Why is it important to control my blood sugar before and after surgery? Improving blood sugar levels before and after surgery helps healing and can limit problems. A way of improving blood sugar control is eating a healthy diet by:  Eating less sugar and carbohydrates  Increasing  activity/exercise  Talking with your doctor about reaching your blood sugar goals High blood sugars (greater than 180 mg/dL) can raise your risk of infections and slow your recovery, so you will need to focus on controlling your diabetes during the weeks before surgery. Make sure that the doctor who takes care of your diabetes knows about your planned surgery including the date and location.  How do I manage my blood sugar before surgery? Check your blood sugar at least 4 times a day, starting 2 days before surgery, to make sure that the level is not too high or low.  Check your blood sugar the morning of your surgery when you wake up and every 2 hours until you get to the  Short Stay unit.  If your blood sugar is less than 70 mg/dL, you will need to treat for low blood sugar: Treat a low blood sugar (less than 70 mg/dL) with  cup of clear juice (cranberry or apple). Recheck blood sugar in 15 minutes after treatment (to make sure it is greater than 70 mg/dL). If your blood sugar is not greater than 70 mg/dL on recheck, call [phone] for further instructions. Report your blood sugar to the short stay nurse when you get to Short Stay.  Day of surgery and If you are admitted to the hospital after surgery: Your blood sugar will be checked by the staff and you will probably be given insulin after surgery (instead of oral diabetes medicines) to make sure you have good blood sugar levels. The goal for blood sugar control after surgery is 80-180 mg/dL.

## 2023-10-30 NOTE — Progress Notes (Signed)
 Spoke w/ via phone for pre-op interview--- pt Lab needs dos----   no      Lab results------ lab appt 11-01-2023 @ 1030 getting CBC/ BMP/ T&S/ EKG COVID test -----patient states asymptomatic no test needed Arrive at ------- 1000 on 11-06-2023 NPO after MN NO Solid Food.  Clear liquids from MN until--- 0900 Pre-Surgery Ensure or G2: n/a  Med rec completed Medications to take morning of surgery ----- nexium Diabetic medication ----- verbalized understanding to stop taking synjardy 72 hours prior to surgery , last dose to be Friday evening 11-02-2023.  GLP1 agonist last dose: 10-25-2023 GLP1 instructions:  pt stated was given instructions from surgeon office to stop 7 days prior to surgery  Patient instructed no nail polish to be worn day of surgery Patient instructed to bring photo id and insurance card day of surgery Patient aware to have Driver (ride ) / caregiver    for 24 hours after surgery - husband, Susan Stafford Patient Special Instructions ----- will pick up soap and written instructions at lab appt Pre-Op special Instructions -----  n/a  Patient verbalized understanding of instructions that were given at this phone interview. Patient denies chest pain, sob, fever, cough at the interview.

## 2023-11-01 ENCOUNTER — Other Ambulatory Visit: Payer: Self-pay

## 2023-11-01 ENCOUNTER — Encounter (HOSPITAL_COMMUNITY)
Admission: RE | Admit: 2023-11-01 | Discharge: 2023-11-01 | Disposition: A | Discharge status: Home / Self Care | Source: Ambulatory Visit | Attending: Obstetrics | Admitting: Obstetrics

## 2023-11-01 DIAGNOSIS — Z01812 Encounter for preprocedural laboratory examination: Secondary | ICD-10-CM | POA: Diagnosis present

## 2023-11-01 DIAGNOSIS — R9389 Abnormal findings on diagnostic imaging of other specified body structures: Secondary | ICD-10-CM | POA: Diagnosis not present

## 2023-11-01 DIAGNOSIS — Z01810 Encounter for preprocedural cardiovascular examination: Secondary | ICD-10-CM | POA: Diagnosis present

## 2023-11-01 DIAGNOSIS — Z01818 Encounter for other preprocedural examination: Secondary | ICD-10-CM | POA: Insufficient documentation

## 2023-11-01 LAB — BASIC METABOLIC PANEL WITH GFR
Anion gap: 14 (ref 5–15)
BUN: 17 mg/dL (ref 8–23)
CO2: 26 mmol/L (ref 22–32)
Calcium: 9 mg/dL (ref 8.9–10.3)
Chloride: 101 mmol/L (ref 98–111)
Creatinine, Ser: 0.58 mg/dL (ref 0.44–1.00)
GFR, Estimated: >60 mL/min (ref >60)
Glucose, Bld: 152 mg/dL — ABNORMAL HIGH (ref 70–99)
Potassium: 4.2 mmol/L (ref 3.5–5.1)
Sodium: 141 mmol/L (ref 135–145)

## 2023-11-01 LAB — CBC
HCT: 43.4 % (ref 36.0–46.0)
Hemoglobin: 15 g/dL (ref 12.0–15.0)
MCH: 31.8 pg (ref 26.0–34.0)
MCHC: 34.6 g/dL (ref 30.0–36.0)
MCV: 91.9 fL (ref 80.0–100.0)
Platelets: 201 K/uL (ref 150–400)
RBC: 4.72 MIL/uL (ref 3.87–5.11)
RDW: 12.3 % (ref 11.5–15.5)
WBC: 6.4 K/uL (ref 4.0–10.5)
nRBC: 0 % (ref 0.0–0.2)

## 2023-11-01 LAB — TYPE AND SCREEN
ABO/RH(D): O NEG
Antibody Screen: NEGATIVE

## 2023-11-06 ENCOUNTER — Ambulatory Visit (HOSPITAL_COMMUNITY): Admitting: Certified Registered Nurse Anesthetist

## 2023-11-06 ENCOUNTER — Other Ambulatory Visit: Payer: Self-pay

## 2023-11-06 ENCOUNTER — Ambulatory Visit (HOSPITAL_BASED_OUTPATIENT_CLINIC_OR_DEPARTMENT_OTHER): Admitting: Certified Registered Nurse Anesthetist

## 2023-11-06 ENCOUNTER — Encounter (HOSPITAL_COMMUNITY): Payer: Self-pay | Admitting: Obstetrics

## 2023-11-06 ENCOUNTER — Encounter (HOSPITAL_COMMUNITY)
Admission: RE | Disposition: A | Discharge status: Home / Self Care | Payer: Self-pay | Source: Home / Self Care | Attending: Obstetrics

## 2023-11-06 ENCOUNTER — Ambulatory Visit (HOSPITAL_COMMUNITY)
Admission: RE | Admit: 2023-11-06 | Discharge: 2023-11-06 | Disposition: A | Discharge status: Home / Self Care | Attending: Obstetrics | Admitting: Obstetrics

## 2023-11-06 DIAGNOSIS — R9389 Abnormal findings on diagnostic imaging of other specified body structures: Secondary | ICD-10-CM | POA: Diagnosis not present

## 2023-11-06 DIAGNOSIS — N819 Female genital prolapse, unspecified: Secondary | ICD-10-CM | POA: Diagnosis not present

## 2023-11-06 DIAGNOSIS — E119 Type 2 diabetes mellitus without complications: Secondary | ICD-10-CM | POA: Diagnosis not present

## 2023-11-06 DIAGNOSIS — E1122 Type 2 diabetes mellitus with diabetic chronic kidney disease: Secondary | ICD-10-CM

## 2023-11-06 DIAGNOSIS — N840 Polyp of corpus uteri: Secondary | ICD-10-CM | POA: Diagnosis not present

## 2023-11-06 DIAGNOSIS — D259 Leiomyoma of uterus, unspecified: Secondary | ICD-10-CM | POA: Insufficient documentation

## 2023-11-06 DIAGNOSIS — N182 Chronic kidney disease, stage 2 (mild): Secondary | ICD-10-CM | POA: Diagnosis not present

## 2023-11-06 DIAGNOSIS — K219 Gastro-esophageal reflux disease without esophagitis: Secondary | ICD-10-CM | POA: Insufficient documentation

## 2023-11-06 DIAGNOSIS — N813 Complete uterovaginal prolapse: Secondary | ICD-10-CM | POA: Diagnosis not present

## 2023-11-06 DIAGNOSIS — N888 Other specified noninflammatory disorders of cervix uteri: Secondary | ICD-10-CM | POA: Diagnosis not present

## 2023-11-06 DIAGNOSIS — N8110 Cystocele, unspecified: Secondary | ICD-10-CM

## 2023-11-06 DIAGNOSIS — N393 Stress incontinence (female) (male): Secondary | ICD-10-CM | POA: Insufficient documentation

## 2023-11-06 DIAGNOSIS — R339 Retention of urine, unspecified: Secondary | ICD-10-CM | POA: Diagnosis present

## 2023-11-06 DIAGNOSIS — Z01818 Encounter for other preprocedural examination: Secondary | ICD-10-CM

## 2023-11-06 DIAGNOSIS — E1140 Type 2 diabetes mellitus with diabetic neuropathy, unspecified: Secondary | ICD-10-CM

## 2023-11-06 HISTORY — DX: Presence of spectacles and contact lenses: Z97.3

## 2023-11-06 HISTORY — PX: CYSTOSCOPY: SHX5120

## 2023-11-06 HISTORY — PX: VAGINAL PROLAPSE REPAIR: SHX830

## 2023-11-06 HISTORY — DX: Abnormal findings on diagnostic imaging of other specified body structures: R93.89

## 2023-11-06 HISTORY — DX: Mixed hyperlipidemia: E78.2

## 2023-11-06 HISTORY — DX: Retention of urine, unspecified: R33.9

## 2023-11-06 HISTORY — DX: Unspecified osteoarthritis, unspecified site: M19.90

## 2023-11-06 HISTORY — DX: Other seasonal allergic rhinitis: J30.2

## 2023-11-06 HISTORY — DX: Psoriasis, unspecified: L40.9

## 2023-11-06 HISTORY — DX: Female genital prolapse, unspecified: N81.9

## 2023-11-06 HISTORY — DX: Chronic kidney disease, stage 2 (mild): N18.2

## 2023-11-06 HISTORY — DX: Type 2 diabetes mellitus without complications: E11.9

## 2023-11-06 HISTORY — PX: BLADDER SUSPENSION: SHX72

## 2023-11-06 HISTORY — DX: Stress incontinence (female) (male): N39.3

## 2023-11-06 HISTORY — PX: ANTERIOR AND POSTERIOR REPAIR: SHX5121

## 2023-11-06 HISTORY — PX: HYSTERECTOMY, VAGINAL, WITH SALPINGECTOMY: SHX7588

## 2023-11-06 HISTORY — PX: PERINEOPLASTY: SHX2218

## 2023-11-06 HISTORY — DX: Other specified disorders of bone density and structure, unspecified site: M85.80

## 2023-11-06 HISTORY — DX: Polyneuropathy, unspecified: G62.9

## 2023-11-06 LAB — GLUCOSE, CAPILLARY
Glucose-Capillary: 130 mg/dL — ABNORMAL HIGH (ref 70–99)
Glucose-Capillary: 220 mg/dL — ABNORMAL HIGH (ref 70–99)
Glucose-Capillary: 179 mg/dL — ABNORMAL HIGH (ref 70–99)

## 2023-11-06 LAB — ABO/RH: ABO/RH(D): O NEG

## 2023-11-06 SURGERY — HYSTERECTOMY, VAGINAL, WITH SALPINGECTOMY
Anesthesia: General | Site: Vagina

## 2023-11-06 MED ORDER — SODIUM CHLORIDE 0.9 % IV SOLN
2.0000 g | INTRAVENOUS | Status: DC
  Filled 2023-11-06: qty 2

## 2023-11-06 MED ORDER — STERILE WATER FOR IRRIGATION IR SOLN
Status: DC | PRN
  Administered 2023-11-06: 1000 mL

## 2023-11-06 MED ORDER — OXYCODONE HCL 5 MG/5ML PO SOLN
5.0000 mg | Freq: Once | ORAL | Status: AC | PRN
  Administered 2023-11-06: 5 mg via ORAL

## 2023-11-06 MED ORDER — ROCURONIUM BROMIDE 10 MG/ML (PF) SYRINGE
PREFILLED_SYRINGE | INTRAVENOUS | Status: AC
Start: 2023-11-06 — End: 2023-11-06
  Filled 2023-11-06: qty 10

## 2023-11-06 MED ORDER — 0.9 % SODIUM CHLORIDE (POUR BTL) OPTIME
TOPICAL | Status: DC | PRN
  Administered 2023-11-06: 1000 mL

## 2023-11-06 MED ORDER — LIDOCAINE 2% (20 MG/ML) 5 ML SYRINGE
INTRAMUSCULAR | Status: DC | PRN
  Administered 2023-11-06: 60 mg via INTRAVENOUS

## 2023-11-06 MED ORDER — FENTANYL CITRATE (PF) 100 MCG/2ML IJ SOLN
25.0000 ug | INTRAMUSCULAR | Status: DC | PRN
  Administered 2023-11-06: 25 ug via INTRAVENOUS
  Administered 2023-11-06: 50 ug via INTRAVENOUS
  Administered 2023-11-06: 25 ug via INTRAVENOUS

## 2023-11-06 MED ORDER — DEXMEDETOMIDINE HCL IN NACL 80 MCG/20ML IV SOLN
INTRAVENOUS | Status: DC | PRN
  Administered 2023-11-06: 8 ug via INTRAVENOUS

## 2023-11-06 MED ORDER — FENTANYL CITRATE (PF) 100 MCG/2ML IJ SOLN
INTRAMUSCULAR | Status: AC
  Filled 2023-11-06: qty 2

## 2023-11-06 MED ORDER — DROPERIDOL 2.5 MG/ML IJ SOLN: 0.6250 mg | Freq: Once | INTRAMUSCULAR | Status: DC | PRN

## 2023-11-06 MED ORDER — SODIUM CHLORIDE 0.9 % IV SOLN
2.0000 g | INTRAVENOUS | Status: AC
  Administered 2023-11-06 (×2): 2 g via INTRAVENOUS

## 2023-11-06 MED ORDER — MIDAZOLAM HCL 2 MG/2ML IJ SOLN
INTRAMUSCULAR | Status: DC | PRN
  Administered 2023-11-06: 2 mg via INTRAVENOUS

## 2023-11-06 MED ORDER — ENOXAPARIN SODIUM 40 MG/0.4ML IJ SOSY
40.0000 mg | PREFILLED_SYRINGE | INTRAMUSCULAR | Status: AC
  Administered 2023-11-06: 40 mg via SUBCUTANEOUS

## 2023-11-06 MED ORDER — OXYCODONE HCL 5 MG/5ML PO SOLN
ORAL | Status: AC
  Filled 2023-11-06: qty 5

## 2023-11-06 MED ORDER — HYDROMORPHONE HCL 1 MG/ML IJ SOLN
INTRAMUSCULAR | Status: AC
  Filled 2023-11-06: qty 0.5

## 2023-11-06 MED ORDER — GABAPENTIN 300 MG PO CAPS
ORAL_CAPSULE | ORAL | Status: AC
  Filled 2023-11-06: qty 1

## 2023-11-06 MED ORDER — METRONIDAZOLE 500 MG/100ML IV SOLN
500.0000 mg | Freq: Once | INTRAVENOUS | Status: AC
  Administered 2023-11-06: 500 mg via INTRAVENOUS

## 2023-11-06 MED ORDER — INSULIN ASPART 100 UNIT/ML IJ SOLN
INTRAMUSCULAR | Status: DC | PRN
  Administered 2023-11-06: 5 [IU] via INTRAVENOUS

## 2023-11-06 MED ORDER — SODIUM CHLORIDE 0.9 % IV SOLN
INTRAVENOUS | Status: AC
  Filled 2023-11-06: qty 2

## 2023-11-06 MED ORDER — METRONIDAZOLE 500 MG/100ML IV SOLN
INTRAVENOUS | Status: AC
  Filled 2023-11-06: qty 100

## 2023-11-06 MED ORDER — GABAPENTIN 300 MG PO CAPS
300.0000 mg | ORAL_CAPSULE | ORAL | Status: AC
  Administered 2023-11-06: 300 mg via ORAL

## 2023-11-06 MED ORDER — OXYCODONE HCL 5 MG PO TABS: 5.0000 mg | ORAL_TABLET | Freq: Once | ORAL | Status: AC | PRN

## 2023-11-06 MED ORDER — ONDANSETRON HCL 4 MG/2ML IJ SOLN
INTRAMUSCULAR | Status: DC | PRN
  Administered 2023-11-06: 4 mg via INTRAVENOUS

## 2023-11-06 MED ORDER — PHENYLEPHRINE 80 MCG/ML (10ML) SYRINGE FOR IV PUSH (FOR BLOOD PRESSURE SUPPORT)
PREFILLED_SYRINGE | INTRAVENOUS | Status: DC | PRN
  Administered 2023-11-06: 80 ug via INTRAVENOUS

## 2023-11-06 MED ORDER — HYDROMORPHONE HCL 1 MG/ML IJ SOLN
INTRAMUSCULAR | Status: DC | PRN
  Administered 2023-11-06: .5 mg via INTRAVENOUS

## 2023-11-06 MED ORDER — ACETAMINOPHEN 10 MG/ML IV SOLN
INTRAVENOUS | Status: DC | PRN
Start: 2023-11-06 — End: 2023-11-06
  Administered 2023-11-06: 1000 mg via INTRAVENOUS

## 2023-11-06 MED ORDER — PHENYLEPHRINE HCL-NACL 20-0.9 MG/250ML-% IV SOLN
INTRAVENOUS | Status: DC | PRN
  Administered 2023-11-06: 25 ug/min via INTRAVENOUS

## 2023-11-06 MED ORDER — DEXAMETHASONE SODIUM PHOSPHATE 10 MG/ML IJ SOLN
INTRAMUSCULAR | Status: AC
Start: 2023-11-06 — End: 2023-11-06
  Filled 2023-11-06: qty 1

## 2023-11-06 MED ORDER — FENTANYL CITRATE (PF) 250 MCG/5ML IJ SOLN
INTRAMUSCULAR | Status: DC | PRN
  Administered 2023-11-06: 50 ug via INTRAVENOUS
  Administered 2023-11-06: 100 ug via INTRAVENOUS
  Administered 2023-11-06 (×2): 50 ug via INTRAVENOUS

## 2023-11-06 MED ORDER — ORAL CARE MOUTH RINSE: 15.0000 mL | Freq: Once | OROMUCOSAL | Status: AC

## 2023-11-06 MED ORDER — SODIUM CHLORIDE 0.9 % IR SOLN
Status: DC | PRN
  Administered 2023-11-06: 1000 mL via INTRAVESICAL

## 2023-11-06 MED ORDER — LIDOCAINE 2% (20 MG/ML) 5 ML SYRINGE
INTRAMUSCULAR | Status: AC
  Filled 2023-11-06: qty 5

## 2023-11-06 MED ORDER — PROPOFOL 10 MG/ML IV BOLUS
INTRAVENOUS | Status: AC
  Filled 2023-11-06: qty 20

## 2023-11-06 MED ORDER — CHLORHEXIDINE GLUCONATE 0.12 % MT SOLN
OROMUCOSAL | Status: AC
  Filled 2023-11-06: qty 15

## 2023-11-06 MED ORDER — ENOXAPARIN SODIUM 40 MG/0.4ML IJ SOSY
PREFILLED_SYRINGE | INTRAMUSCULAR | Status: AC
  Filled 2023-11-06: qty 0.4

## 2023-11-06 MED ORDER — DEXAMETHASONE SODIUM PHOSPHATE 10 MG/ML IJ SOLN
INTRAMUSCULAR | Status: DC | PRN
  Administered 2023-11-06: 5 mg via INTRAVENOUS

## 2023-11-06 MED ORDER — ACETAMINOPHEN 500 MG PO TABS
ORAL_TABLET | ORAL | Status: AC
  Filled 2023-11-06: qty 2

## 2023-11-06 MED ORDER — PROPOFOL 10 MG/ML IV BOLUS
INTRAVENOUS | Status: DC | PRN
  Administered 2023-11-06: 25 ug/kg/min via INTRAVENOUS
  Administered 2023-11-06: 100 mg via INTRAVENOUS

## 2023-11-06 MED ORDER — LACTATED RINGERS IV SOLN: INTRAVENOUS | Status: DC | PRN

## 2023-11-06 MED ORDER — LACTATED RINGERS IV SOLN: INTRAVENOUS | Status: DC

## 2023-11-06 MED ORDER — SUGAMMADEX SODIUM 200 MG/2ML IV SOLN
INTRAVENOUS | Status: DC | PRN
  Administered 2023-11-06: 200 mg via INTRAVENOUS

## 2023-11-06 MED ORDER — ROCURONIUM BROMIDE 10 MG/ML (PF) SYRINGE
PREFILLED_SYRINGE | INTRAVENOUS | Status: DC | PRN
  Administered 2023-11-06: 30 mg via INTRAVENOUS
  Administered 2023-11-06 (×2): 20 mg via INTRAVENOUS
  Administered 2023-11-06: 40 mg via INTRAVENOUS
  Administered 2023-11-06: 10 mg via INTRAVENOUS
  Administered 2023-11-06: 30 mg via INTRAVENOUS

## 2023-11-06 MED ORDER — KETOROLAC TROMETHAMINE 30 MG/ML IJ SOLN
INTRAMUSCULAR | Status: AC
  Filled 2023-11-06: qty 1

## 2023-11-06 MED ORDER — MIDAZOLAM HCL 2 MG/2ML IJ SOLN
INTRAMUSCULAR | Status: AC
Start: 2023-11-06 — End: 2023-11-06
  Filled 2023-11-06: qty 2

## 2023-11-06 MED ORDER — ONDANSETRON HCL 4 MG/2ML IJ SOLN
INTRAMUSCULAR | Status: AC
  Filled 2023-11-06: qty 2

## 2023-11-06 MED ORDER — FENTANYL CITRATE (PF) 250 MCG/5ML IJ SOLN
INTRAMUSCULAR | Status: AC
  Filled 2023-11-06: qty 5

## 2023-11-06 MED ORDER — SODIUM CHLORIDE (PF) 0.9 % IJ SOLN
INTRAMUSCULAR | Status: DC | PRN
  Administered 2023-11-06: 50 mL via SURGICAL_CAVITY

## 2023-11-06 MED ORDER — CHLORHEXIDINE GLUCONATE 0.12 % MT SOLN
15.0000 mL | Freq: Once | OROMUCOSAL | Status: AC
  Administered 2023-11-06: 15 mL via OROMUCOSAL

## 2023-11-06 MED ORDER — ACETAMINOPHEN 10 MG/ML IV SOLN: 1000.0000 mg | Freq: Once | INTRAVENOUS | Status: DC | PRN

## 2023-11-06 MED ORDER — ACETAMINOPHEN 500 MG PO TABS
1000.0000 mg | ORAL_TABLET | ORAL | Status: AC
  Administered 2023-11-06: 1000 mg via ORAL

## 2023-11-06 MED ORDER — PHENAZOPYRIDINE HCL 100 MG PO TABS
ORAL_TABLET | ORAL | Status: AC
  Filled 2023-11-06: qty 2

## 2023-11-06 MED ORDER — PHENAZOPYRIDINE HCL 100 MG PO TABS
200.0000 mg | ORAL_TABLET | ORAL | Status: AC
  Administered 2023-11-06: 200 mg via ORAL

## 2023-11-06 SURGICAL SUPPLY — 55 items
BAG DRAIN URO-CYSTO SKYTR STRL (DRAIN) ×4 IMPLANT
BLADE CLIPPER SENSICLIP SURGIC (BLADE) ×4 IMPLANT
BLADE SURG 15 STRL LF DISP TIS (BLADE) ×4 IMPLANT
COVER MAYO STAND STRL (DRAPES) IMPLANT
DERMABOND ADVANCED .7 DNX12 (GAUZE/BANDAGES/DRESSINGS) ×4 IMPLANT
DEVICE CAPIO SLIM SINGLE (INSTRUMENTS) IMPLANT
DRAPE SHEET LG 3/4 BI-LAMINATE (DRAPES) ×4 IMPLANT
ELECTRODE REM PT RTRN 9FT ADLT (ELECTROSURGICAL) IMPLANT
GAUZE 4X4 16PLY ~~LOC~~+RFID DBL (SPONGE) ×4 IMPLANT
GLOVE BIOGEL PI IND STRL 6 (GLOVE) ×4 IMPLANT
GLOVE BIOGEL PI MICRO STRL 5.5 (GLOVE) ×4 IMPLANT
GLOVE SS PI 5.5 STRL (GLOVE) ×4 IMPLANT
GOWN STRL REUS W/ TWL LRG LVL3 (GOWN DISPOSABLE) ×4 IMPLANT
GOWN STRL REUS W/TWL LRG LVL3 (GOWN DISPOSABLE) ×4 IMPLANT
HIBICLENS CHG 4% 4OZ (MISCELLANEOUS) ×4 IMPLANT
HIBICLENS CHG 4% 4OZ BTL (MISCELLANEOUS) ×4 IMPLANT
HOLDER FOLEY CATH W/STRAP (MISCELLANEOUS) ×4 IMPLANT
IV NS 1000ML BAXH (IV SOLUTION) IMPLANT
KIT TURNOVER KIT B (KITS) ×4 IMPLANT
LIGASURE IMPACT 36 18CM CVD LR (INSTRUMENTS) IMPLANT
MANIFOLD NEPTUNE II (INSTRUMENTS) ×4 IMPLANT
NDL HYPO 22X1.5 SAFETY MO (MISCELLANEOUS) ×4 IMPLANT
NDL MAYO 6 CRC TAPER PT (NEEDLE) ×4 IMPLANT
NEEDLE HYPO 22X1.5 SAFETY MO (MISCELLANEOUS) ×4 IMPLANT
NEEDLE MAYO 6 CRC TAPER PT (NEEDLE) ×4 IMPLANT
NS IRRIG 1000ML POUR BTL (IV SOLUTION) ×4 IMPLANT
PACK CYSTO (CUSTOM PROCEDURE TRAY) ×4 IMPLANT
PACK VAGINAL WOMENS (CUSTOM PROCEDURE TRAY) ×4 IMPLANT
PAD OB MATERNITY 11 LF (PERSONAL CARE ITEMS) ×4 IMPLANT
RETRACTOR LONE STAR DISPOSABLE (INSTRUMENTS) ×4 IMPLANT
RETRACTOR STAY HOOK 5MM (MISCELLANEOUS) ×4 IMPLANT
SET CYSTO W/LG BORE CLAMP LF (SET/KITS/TRAYS/PACK) ×4 IMPLANT
SET IRRIG Y TYPE TUR BLADDER L (SET/KITS/TRAYS/PACK) ×4 IMPLANT
SLEEVE SCD COMPRESS KNEE MED (STOCKING) ×4 IMPLANT
SLING ADVANTAGE FIT TRANVAG (Sling) IMPLANT
SPIKE FLUID TRANSFER (MISCELLANEOUS) ×4 IMPLANT
SPONGE T-LAP 18X36 ~~LOC~~+RFID STR (SPONGE) ×4 IMPLANT
SUCTION TUBE FRAZIER 10FR DISP (SUCTIONS) ×4 IMPLANT
SURGIFLO W/THROMBIN 8M KIT (HEMOSTASIS) IMPLANT
SUT ABS MONO DBL WITH NDL 48IN (SUTURE) IMPLANT
SUT MON AB 2-0 SH27 (SUTURE) IMPLANT
SUT PDS AB 2-0 CT2 27 (SUTURE) ×4 IMPLANT
SUT PDS PLUS AB 0 CT-2 (SUTURE) ×16 IMPLANT
SUT VIC AB 0 CT1 27XBRD ANBCTR (SUTURE) IMPLANT
SUT VIC AB 0 CT1 27XCR 8 STRN (SUTURE) ×4 IMPLANT
SUT VIC AB 2-0 SH 27XBRD (SUTURE) ×4 IMPLANT
SUT VIC AB 3-0 SH 18 (SUTURE) IMPLANT
SUT VICRYL 2-0 SH 8X27 (SUTURE) IMPLANT
SYR 10ML LL (SYRINGE) IMPLANT
SYR BULB EAR ULCER 3OZ GRN STR (SYRINGE) ×4 IMPLANT
TOWEL GREEN STERILE (TOWEL DISPOSABLE) ×4 IMPLANT
TOWEL GREEN STERILE FF (TOWEL DISPOSABLE) ×4 IMPLANT
TRAY FOLEY W/BAG SLVR 14FR (SET/KITS/TRAYS/PACK) ×4 IMPLANT
TRAY FOLEY W/BAG SLVR 14FR LF (SET/KITS/TRAYS/PACK) ×4 IMPLANT
UNDERPAD 30X36 HEAVY ABSORB (UNDERPADS AND DIAPERS) ×4 IMPLANT

## 2023-11-06 NOTE — Anesthesia Procedure Notes (Signed)
 Procedure Name: Intubation Date/Time: 11/06/2023 12:26 PM  Performed by: Jolynn Mage, CRNAPre-anesthesia Checklist: Patient identified, Patient being monitored, Timeout performed, Emergency Drugs available and Suction available Patient Re-evaluated:Patient Re-evaluated prior to induction Oxygen Delivery Method: Circle System Utilized Preoxygenation: Pre-oxygenation with 100% oxygen Induction Type: IV induction Ventilation: Mask ventilation without difficulty Laryngoscope Size: Miller and 2 Grade View: Grade I Tube type: Oral Tube size: 7.0 mm Number of attempts: 1 Airway Equipment and Method: Stylet Placement Confirmation: ETT inserted through vocal cords under direct vision, positive ETCO2 and breath sounds checked- equal and bilateral Secured at: 21 cm Tube secured with: Tape Dental Injury: Teeth and Oropharynx as per pre-operative assessment

## 2023-11-06 NOTE — Anesthesia Preprocedure Evaluation (Signed)
 Anesthesia Evaluation  Patient identified by MRN, date of birth, ID band Patient awake    Reviewed: Allergy & Precautions, H&P , NPO status , Patient's Chart, lab work & pertinent test results  History of Anesthesia Complications Negative for: history of anesthetic complications  Airway Mallampati: II  TM Distance: >3 FB Neck ROM: Full    Dental no notable dental hx.    Pulmonary neg pulmonary ROS, neg COPD   Pulmonary exam normal breath sounds clear to auscultation       Cardiovascular (-) hypertension(-) angina (-) Past MI negative cardio ROS Normal cardiovascular exam Rhythm:Regular Rate:Normal     Neuro/Psych neg Seizures negative neurological ROS  negative psych ROS   GI/Hepatic Neg liver ROS,GERD  ,,  Endo/Other  diabetes, Type 2    Renal/GU CRFRenal disease  negative genitourinary   Musculoskeletal  (+) Arthritis ,    Abdominal   Peds negative pediatric ROS (+)  Hematology negative hematology ROS (+)   Anesthesia Other Findings   Reproductive/Obstetrics negative OB ROS                              Anesthesia Physical Anesthesia Plan  ASA: 3  Anesthesia Plan: General   Post-op Pain Management: Tylenol  PO (pre-op)*   Induction: Intravenous  PONV Risk Score and Plan: 3 and Ondansetron , Dexamethasone  and Treatment may vary due to age or medical condition  Airway Management Planned: Oral ETT  Additional Equipment: None  Intra-op Plan:   Post-operative Plan: Extubation in OR  Informed Consent: I have reviewed the patients History and Physical, chart, labs and discussed the procedure including the risks, benefits and alternatives for the proposed anesthesia with the patient or authorized representative who has indicated his/her understanding and acceptance.     Dental advisory given  Plan Discussed with: CRNA  Anesthesia Plan Comments:         Anesthesia  Quick Evaluation

## 2023-11-06 NOTE — Discharge Instructions (Signed)
 POST OPERATIVE INSTRUCTIONS  General Instructions Recovery (not bed rest) will last approximately 6 weeks Walking is encouraged, but refrain from strenuous exercise/ housework/ heavy lifting. No lifting >10lbs  Nothing in the vagina- NO intercourse, tampons or douching Bathing:  Do not submerge in water  (NO swimming, bath, hot tub, etc) until after your postop visit. You can shower starting the day after surgery.  No driving until you are not taking narcotic pain medicine and until your pain is well enough controlled that you can slam on the breaks or make sudden movements if needed.   Taking your medications Please take your acetaminophen  and ibuprofen  on a schedule for the first 48 hours. Take 600mg  ibuprofen , then take 500mg  acetaminophen  3 hours later, then continue to alternate ibuprofen  and acetaminophen . That way you are taking each type of medication every 6 hours. Take the prescribed narcotic (oxycodone , tramadol , etc) as needed, with a maximum being every 4 hours.  Take a stool softener daily to keep your stools soft and preventing you from straining. If you have diarrhea, you decrease your stool softener. This is explained more below. We have prescribed you Miralax .  Reasons to Call the Nurse (see last page for phone numbers) Heavy Bleeding (changing your pad every 1-2 hours) Persistent nausea/vomiting Fever (100.4 degrees or more) Incision problems (pus or other fluid coming out, redness, warmth, increased pain)  Things to Expect After Surgery Mild to Moderate pain is normal during the first day or two after surgery. If prescribed, take Ibuprofen  or Tylenol  first and use the stronger medicine for "break-through" pain. You can overlap these medicines because they work differently.   Constipation   To Prevent Constipation:  Eat a well-balanced diet including protein, grains, fresh fruit and vegetables.  Drink plenty of fluids. Walk regularly.  Depending on specific instructions  from your physician: take Miralax  daily and additionally you can add a stool softener (colace/ docusate) and fiber supplement. Continue as long as you're on pain medications.   To Treat Constipation:  If you do not have a bowel movement in 2 days after surgery, you can take 2 Tbs of Milk of Magnesia 1-2 times a day until you have a bowel movement. If diarrhea occurs, decrease the amount or stop the laxative. If no results with Milk of Magnesia, you can drink a bottle of magnesium citrate which you can purchase over the counter.  Fatigue:  This is a normal response to surgery and will improve with time.  Plan frequent rest periods throughout the day.  Gas Pain:  This is very common but can also be very painful! Drink warm liquids such as herbal teas, bouillon or soup. Walking will help you pass more gas.  Mylicon or Gas-X can be taken over the counter.  Leaking Urine:  Varying amounts of leakage may occur after surgery.  This should improve with time. Your bladder needs at least 3 months to recover from surgery. If you leak after surgery, be sure to mention this to your doctor at your post-op visit. If you were taking medications for overactive bladder prior to surgery, be sure to restart the medications immediately after surgery.  Incisions: If you have incisions on your abdomen, the skin glue will dissolve on its own over time. It is ok to gently rinse with soap and water  over these incisions but do not scrub.  Catheter Approximately 50% of patients are unable to urinate after surgery and need to go home with a catheter. This allows your bladder to  rest so it can return to full function. If you go home with a catheter, the office will call to set up a voiding trial a few days after surgery. For most patients, by this visit, they are able to urinate on their own. Long term catheter use is rare.   Return to Work  As work demands and recovery times vary widely, it is hard to predict when you will want  to return to work. If you have a desk job with no strenuous physical activity, and if you would like to return sooner than generally recommended, discuss this with your provider or call our office.   Post op concerns  For non-emergent issues, please call the Urogynecology Nurse. Please leave a message and someone will contact you within one business day.  You can also send a message through MyChart.   AFTER HOURS (After 5:00 PM and on weekends):  For urgent matters that cannot wait until the next business day. Call our office [phone] and connect to the doctor on call.  Please reserve this for important issues.   **FOR ANY TRUE EMERGENCY ISSUES CALL 911 OR GO TO THE NEAREST EMERGENCY ROOM.** Please inform our office or the doctor on call of any emergency.     APPOINTMENTS: Call [phone]  Please monitor your blood sugar postoperatively, if consistently > 180, please contact your primary care provider within 2-3 days regarding medication adjustments.   Resume Mounjaro after return of bowel movement.

## 2023-11-06 NOTE — Transfer of Care (Signed)
 Immediate Anesthesia Transfer of Care Note  Patient: Susan Stafford  Procedure(s) Performed: HYSTERECTOMY, VAGINAL, WITH SALPINGO-OOPHORECTOMY (Vagina ) SUSPENSION, VAGINAL VAULT (Vagina ) ANTERIOR (CYSTOCELE) AND POSTERIOR REPAIR (RECTOCELE) (Vagina ) CREATION, URETHRAL SLING, RETROPUBIC APPROACH, USING POLYPROPYLENE TAPE (Urethra) PERINEOPLASTY (Perineum) CYSTOSCOPY (Bladder)  Patient Location: PACU  Anesthesia Type:General  Level of Consciousness: drowsy  Airway & Oxygen Therapy: Patient Spontanous Breathing and Patient connected to face mask oxygen  Post-op Assessment: Report given to RN and Post -op Vital signs reviewed and stable  Post vital signs: Reviewed and stable  Last Vitals:  Vitals Value Taken Time  BP 139/80 11/06/23 17:45  Temp    Pulse 83 11/06/23 17:48  Resp 2 11/06/23 17:48  SpO2 100 % 11/06/23 17:48  Vitals shown include unfiled device data.  Last Pain:  Vitals:   11/06/23 1024  TempSrc: Oral  PainSc: 0-No pain      Patients Stated Pain Goal: 6 (11/06/23 1024)  Complications: No notable events documented.

## 2023-11-06 NOTE — Progress Notes (Signed)
 Dr Guadlupe aware installed 300 mls in Foley, removed and ambulated and then went to bathroom and unable to void. 12 French inserted, output with leg strap attached. Foley teaching done with patient and family. Patient discharged home and told to call Dr Guadlupe when to come in to get Foley discharged.

## 2023-11-06 NOTE — Op Note (Signed)
 Operative Note  Preoperative Diagnosis: Stage III pelvic organ prolapse, stress urinary incontinence, Type II diabetes  Postoperative Diagnosis: Stage III pelvic organ prolapse, stress urinary incontinence, Type II diabetes  Procedures performed: Exam under anesthesia, transvaginal hysterectomy, bilateral salpingo-oophorectomy, uterosacral ligament suspension, anterior/posterior repair, perineorrhaphy, midurethral sling, cystourethroscopy    Implants:  Implant Name Type Inv. Item Serial No. Manufacturer Lot No. LRB No. Used Action  Susan Stafford [facility] - [id number] Sling SLING ADVANTAGE FIT [facility]  [employer] [id number] N/A 1 Implanted    Attending Surgeon: Lianne Leila Gillis, MD  Assistant Surgeon: n/a  Assistant: Jorene Moats, PA  Anesthesia: General endotracheal  Findings: 1. On vaginal exam, stage III pelvic organ prolapse present  2. On cystoscopy, normal bladder and urethral mucosa without injury or lesion. Brisk bilateral ureteral efflux present.    Specimens:  ID Type Source Tests Collected by Time Destination  1 : uterus, cervix, bilateral fallopian tubes, bilateral ovaries Tissue PATH Gyn benign resection SURGICAL PATHOLOGY Gillis Lianne T, MD 11/06/2023 1017     Estimated blood loss: 250 mL  IV fluids: 2500 mL  Urine output: 850 mL  Complications: none  Procedure in Detail: After informed consent was obtained, the patient was taken to the operating room where she was placed under anesthesia.  She was then placed in the dorsal lithotomy position with Allen stirrups and prepped and draped in the usual sterile fashion.  Care was taken to avoid hyperflexion or hyperextension of her lower extremities.    A self-retaining retractor was placed, and a foley catheter was placed. The cervix was grasped with two tenacula.  The cervix was injected circumferentially with 1% lidocaine  with epinephrine . A bovie was used to incise circumferentially around the cervix.  The  posterior vagina was grasped with a Kocher clamp. The Mayo scissors were used to enter the posterior cul-de-sac. Palpation confirmed peritoneal entry and no adhesions. Moist laparotomy sponges were used to pack the smalle bowel. The posterior peritoneum was affixed to the vaginal cuff with 0-Vicryl (this suture was used throughout unless otherwise specified) at the midline. A right angle retractor was placed through the posterior colpotomy. A Heaney clamp was then used to clamp the uterosacral ligaments on each side. These were cut and suture ligated using 0-Vicryl in a Heaney fashion, and tagged with hemostats. Anteriorly, the bladder was dissected off the pubocervical fascia using Metzenbaum scissors. A Deaver retractor was placed anteriorly to protect the bladder. The anterior peritoneal reflection was then identified, tented up and incised with Metzenbaum scissors to create an anterior colpotomy. Palpation confirmed peritoneal entry and no adhesions. The Deaver was placed anteriorly to protect the bladder. The cardinal ligaments were clamped, ligated and cut with 0-vicryl sutures in a similar fashion on each side. The uterine arteries were also clamped and ligated with 0-vicryl sutures. The cornua were clamped, cut, free-tied and suture ligated. The uterus and cervix were handed off the field.  Inspection of the pedicles revealed excellent hemostasis.  Additional moist laparotomy sponges were used to pack the small bowel. The right fallopian tube and ovary were grapsed with a Babcock clamp, clamped with a Kelly, cut, and ligated. The left fallopian tube and ovary was clamped, cut, and ligated in a similar fashion. For the uterosacral ligament suspension (USLS), the bowel was packed away with an additional moistened lap pad. The posterior cuff edge was grasped with an Allis clamp.  The right and left uterosacral ligaments were identified visually and digitally. Two stitches of 0 PDS  was placed through each  uterosacral ligament towards its insertion site at the sacrum. These were tagged. The packing was removed. The foley catheter was removed.  A 70-degree cystoscope was introduced, and 360-degree inspection revealed no trauma in the bladder, with bilateral ureteral efflux with tension on the uterosacral sutures.  The bladder was drained and the cystoscope was removed.  The Foley catheter was reinserted.    For the anterior repair, two Allis clamps were placed along the midline of the anterior vaginal wall.  1% lidocaine  with epinephrine  was injected into the vaginal mucosa.  A 15 blade scalpel was used to incise the vaginal mucosa in the midline. Allis clamps were placed along this incision and Metzenbaum scissors were used to sharply dissect the epithelium off of the vesicovaginal septum bilaterally to the level of the pubic rami. Anterior plication of the vesicovaginal septum was then performed using 2-0 PDS. The vaginal mucosal edges were trimmed and the incision reapproximated with 2-0 Vicryl in a continuous running fashion. Hemostasis was noted. The uterosacral stitches were then attached to the posterior and anterior edges of the vaginal cuff on the ipsilateral sides, in a through and through fashion, using a free needle. The vaginal cuff was closed vertically in a continuous running fashion with an 0-Vicryl suture. The lateral uterosacral stitches were then tied down with good apical support noted. The Foley catheter was removed. A 70-degree cystoscope was introduced, and 360-degree inspection revealed no trauma in the bladder, with bilateral ureteral efflux. The cystoscope was removed. The medial uterosacral sutures were then tied down. Cystoscopy was repeated and brisk bilateral ureteral efflux was noted. The bladder was drained and the cystoscope was removed.  The Foley catheter was reinserted.  The mid urethral area was located on the anterior vaginal wall.  Two Allis clamps were placed at the level of  the midurethra. 1% lidocaine  with epinephrine  was injected into the vaginal mucosa. A vertical incision was made between the two clamps using a 15-blade scalpel.  Using sharp dissection, Metzenbaum scissors were used to make a periurethral tunnel from the vaginal incision towards the pubic rami bilaterally for the future sling tracts. The bladder was ensured to be empty. The trocar and attached sling were introduced into the right side of the periurethral vaginal incision, just inferior to the pubic symphysis on the right side. The trocar was guided through the endopelvic fascia and directly vertically.  While hugging the cephalad surface of the pubic bone, the trocar was guided out through the abdomen 2 fingerbreadths lateral to midline at the level of the pubic symphysis on the ipsilateral side. The trocar was placed on the left side in a similar fashion.  A 70-degree cystoscope was introduced, and 360-degree inspection revealed no trauma or trocars in the bladder, with brisk bilateral ureteral efflux.  The bladder was drained and the cystoscope was removed.  The Foley catheter was reinserted.  The sling was brought to lie beneath the mid-urethra.  A Babcock clamp was placed to hold a loop of sling to ensure no tension.   The plastic sheath was removed from the sling and the distal ends of the sling were trimmed just below the level of the skin incisions.  Tension-free positioning of the sling was confirmed after the Jenna was released. Vaginal inspection revealed no vaginotomy or sling perforations of the mucosa.  A Crede maneuver showed no urinary leakage. The vaginal mucosal edges were reapproximated using 2-0 Vicryl.  The vagina was copiously irrigated.  Hemostasis was  again noted. Vaginal packing not placed. The suprapubic sling incisions were closed with Dermabond.  Attention was then turned to the posterior vagina.  Two Allis clamps were placed at the introitus approximately 3cm from the urethra meatus.  1% lidocaine  with epinephrine  was injected into the vaginal mucosa in the posterior vaginal wall and perineum for hydrodissection and hemostasis. A diamond shape incision was made between these clamps with a 15 blade scalpel and a diamond shaped area of epithelium was dissected at the introitus.  The rectovaginal septum was then dissected off the vaginal mucosa bilaterally. The rectovaginal septum was then plicated in a continuous running fashion with 2-0 PDS while one finger was placed in the rectum to prevent rectal penetration.  After placement of the first plication stitch two fingers were inserted into the vaginal to confirm adequate caliber.  The suture incorporated the perineal body in a U stitch fashion and the bulbocavernosus muscles. A 2-0 Vicryl was used in a subcuticular fashion to re-approximate the hymenal ring. After plication, the excess vaginal mucosa was trimmed and the vaginal mucosa was reapproximated using 2-0 Vicryl suture in a continuous fashion.  The vagina was copiously irrigated.  Hemostasis was noted. A rectal examination was normal and confirmed no sutures within the rectum. Three fingers passed through the vaginal opening without difficulty.

## 2023-11-06 NOTE — Interval H&P Note (Addendum)
 History and Physical Interval Note:  11/06/2023 11:39 AM  Susan Stafford  has presented today for surgery, with the diagnosis of thickened endometrium, pelvic organ prolapse stage 3, incomplete bladder emptying, stress urinary incontinence.  The various methods of treatment have been discussed with the patient and family. After consideration of risks, benefits and other options for treatment, the patient has consented to  Procedure(s) with comments: HYSTERECTOMY, VAGINAL, WITH SALPINGO-OOPHORECTOMY (N/A) - total time 3.5 hours SUSPENSION, VAGINAL VAULT (N/A) ANTERIOR (CYSTOCELE) AND POSTERIOR REPAIR (RECTOCELE) (N/A) CREATION, URETHRAL SLING, RETROPUBIC APPROACH, USING POLYPROPYLENE TAPE (N/A) PERINEOPLASTY (N/A) CYSTOSCOPY (N/A) as a surgical intervention.  The patient's history has been reviewed, patient examined, no change in status, stable for surgery.  I have reviewed the patient's chart and labs.  Questions were answered to the patient's satisfaction.    Glucose 130 today Hold Mounjaro until return of GI function and bowel movements.  Advised postponing steroids until after recovery if possible for skin rash, can start if itching or worsening rash affecting QoL.  Discussed postop glycemic control with goal < 180 and possible adjustment of medications as needed.   Susan Stafford

## 2023-11-07 ENCOUNTER — Encounter (HOSPITAL_COMMUNITY): Payer: Self-pay | Admitting: Obstetrics

## 2023-11-07 ENCOUNTER — Ambulatory Visit

## 2023-11-07 ENCOUNTER — Telehealth: Payer: Self-pay

## 2023-11-07 NOTE — Telephone Encounter (Signed)
 Susan Stafford  underwent Hysterectomy, Vaginal, With Salpingo-oophorectomy, Suspension, Vaginal Vault, Anterior (cystocele) And Posterior Repair (rectocele), Creation, Urethral Sling, Retropubic Approach, Using Polypropylene Tape, Perineoplasty, and Cystoscopy  on 11/06/2023  with [] Dr Marilynne [x] Dr Guadlupe.  The patient reports that her pain is controlled.  She is taking [] No Medication [x] Acetaminophen  500mg  every 6 hours [x] Ibuprofen  600mg  every 6 hours or [x]  Prescribed Narcotic.  Her pain level is 1[x] with medication.   She reports vaginal bleeding.  The patient is tolerating PO fluids and solids. She has not had a bowel movement and is taking Miralax  for a bowel regimen. She's not passing gas.  She was discharged with a catheter.   []  Discharged without a catheter, the patient does feel as if she is emptying her bladder.  [x] Discharged with a catheter, the patient is not having any concerns with her catheter.  She will return for a voiding trial. [] Verified scheduled date and time with patient.  She does having any additional questions.  Reviewed Post operative instructions as needed to answer additional questions.

## 2023-11-07 NOTE — Progress Notes (Signed)
 Susan Stafford underwent Hysterectomy, Vaginal, With Salpingo-oophorectomy, Suspension, Vaginal Vault, Anterior (cystocele) And Posterior Repair (rectocele), Creation, Urethral Sling, Retropubic Approach, Using Polypropylene Tape, Perineoplasty, and Cystoscopy on 11/06/2023  She presents for a voiding trial.   Patient was identified with 2 identifiers.  The patient states she does not have any concerns with the foley placed.  350 mL of NS was instilled into the bladder via a catheter.  The catheter was removed and patient was instructed to void into the urinary hat.  She voided 200 mL.  The post void residual measured by bladder scan was 172 mL.  She did pass the voiding trial.  The patient was not sent home with a catheter.    The patient received aftercare instructions and will follow up as scheduled.     CC'd note to patient's provider.   Susan Stafford  underwent Hysterectomy, Vaginal, With Salpingo-oophorectomy, Suspension, Vaginal Vault, Anterior (cystocele) And Posterior Repair (rectocele), Creation, Urethral Sling, Retropubic Approach, Using Polypropylene Tape, Perineoplasty, and Cystoscopy  on 11/06/2023  with [] Dr Marilynne [x] Dr Guadlupe.  The patient reports that her pain is controlled.  She is taking [] No Medication [x] Acetaminophen  500mg  every 6 hours [x] Ibuprofen  600mg  every 6 hours or [x]  Prescribed Narcotic.  Her pain level is 1[x] with medication.   She reports vaginal bleeding.  The patient is tolerating PO fluids and solids. She has not had a bowel movement and is taking Miralax  for a bowel regimen. She's not passing gas.  She was discharged with a catheter.   []  Discharged without a catheter, the patient does feel as if she is emptying her bladder.  [x] Discharged with a catheter, the patient is not having any concerns with her catheter.  She will return for a voiding trial. [] Verified scheduled date and time with patient.  She does having any additional questions.  Reviewed Post operative  instructions as needed to answer additional questions.

## 2023-11-07 NOTE — Anesthesia Postprocedure Evaluation (Signed)
 Anesthesia Post Note  Patient: Sports administrator  Procedure(s) Performed: HYSTERECTOMY, VAGINAL, WITH SALPINGO-OOPHORECTOMY (Vagina ) SUSPENSION, VAGINAL VAULT (Vagina ) ANTERIOR (CYSTOCELE) AND POSTERIOR REPAIR (RECTOCELE) (Vagina ) CREATION, URETHRAL SLING, RETROPUBIC APPROACH, USING POLYPROPYLENE TAPE (Urethra) PERINEOPLASTY (Perineum) CYSTOSCOPY (Bladder)     Patient location during evaluation: PACU Anesthesia Type: General Level of consciousness: awake and alert Pain management: pain level controlled Vital Signs Assessment: post-procedure vital signs reviewed and stable Respiratory status: spontaneous breathing, nonlabored ventilation, respiratory function stable and patient connected to nasal cannula oxygen Cardiovascular status: blood pressure returned to baseline and stable Postop Assessment: no apparent nausea or vomiting Anesthetic complications: no   No notable events documented.  Last Vitals:  Vitals:   11/06/23 1845 11/06/23 1900  BP: 129/74 132/71  Pulse: 79 78  Resp: (!) 9 (!) 5  Temp:  36.5 C  SpO2: 96% 95%    Last Pain:  Vitals:   11/06/23 1815  TempSrc:   PainSc: 8                  Nieve Rojero P Ihor Meinzer

## 2023-11-08 ENCOUNTER — Ambulatory Visit: Payer: Self-pay | Admitting: Obstetrics

## 2023-11-08 LAB — SURGICAL PATHOLOGY

## 2023-11-30 DIAGNOSIS — M542 Cervicalgia: Secondary | ICD-10-CM | POA: Diagnosis not present

## 2023-11-30 DIAGNOSIS — R202 Paresthesia of skin: Secondary | ICD-10-CM | POA: Diagnosis not present

## 2023-12-10 ENCOUNTER — Other Ambulatory Visit: Payer: Self-pay | Admitting: Endocrinology

## 2023-12-10 DIAGNOSIS — Z1231 Encounter for screening mammogram for malignant neoplasm of breast: Secondary | ICD-10-CM

## 2023-12-11 DIAGNOSIS — M542 Cervicalgia: Secondary | ICD-10-CM | POA: Diagnosis not present

## 2023-12-11 DIAGNOSIS — R202 Paresthesia of skin: Secondary | ICD-10-CM | POA: Diagnosis not present

## 2023-12-17 ENCOUNTER — Ambulatory Visit: Admitting: Obstetrics

## 2023-12-17 VITALS — BP 120/74 | HR 93

## 2023-12-17 DIAGNOSIS — Z48816 Encounter for surgical aftercare following surgery on the genitourinary system: Secondary | ICD-10-CM

## 2023-12-17 DIAGNOSIS — E119 Type 2 diabetes mellitus without complications: Secondary | ICD-10-CM

## 2023-12-17 NOTE — Assessment & Plan Note (Signed)
-   T2DM with neuropathy, HbA1C  6.6 in 04/2023 at goal < 8 preop - Dr. Nichole repeats HbA1C every 3 months per pt - discussed risk of transient hyperglycemia postop that may require adjustment of medications and possible association with her feeling of fatigue. - encouraged to continue to monitor glycemic control and resume regular activity

## 2023-12-17 NOTE — Patient Instructions (Signed)
 Continue to monitor your blood sugar levels.  Please call if you experience any change in urinary or vaginal symptoms.

## 2023-12-17 NOTE — Progress Notes (Signed)
 [facility]  Date of Visit: 12/17/2023  History of Present Illness: Susan Stafford is a 68 y.o. female scheduled today for a post-operative visit.   Surgery: s/p Exam under anesthesia, transvaginal hysterectomy, bilateral salpingo-oophorectomy, uterosacral ligament suspension, anterior/posterior repair, perineorrhaphy, midurethral sling, cystourethroscopy on 11/06/23  She passed her postoperative void trial on POD#1.   Postoperative course was uncomplicated.   Today she reports headaches for 5 weeks with difficulty to control blood sugars Reports glucose 101-180s, baseline average peaks around 140s.  UTI in the last 6 weeks? No  Pain? No , intermittent 0.5/10 pain in suprapubic region She has not returned to her normal activity (except for postop restrictions), reduced desire to resume activity until 1 week ago.  Daughter getting married Saturday in [place] Vaginal bulge? No  Stress incontinence: No  Urgency/frequency: No  Urge incontinence: No  Voiding dysfunction: No  Bowel issues: Yes , taking probiotics and reports cramping prior to bowel movements without straining. Bowel movements 1x/day with improvement in the past week, closer to baseline.  Subjective Success: Do you usually have a bulge or something falling out that you can see or feel in the vaginal area? No  Retreatment Success: Any retreatment with surgery or pessary for any compartment? No   Pathology results:  A. UTERUS INCLUDING CERVIX, BILATERAL FALLOPIAN TUBES AND OVARIES, TOTAL HYSTERECTOMY AND BILATERAL SALPINGO-OOPHORECTOMY: Cervix:           Unremarkable.           Negative for dysplasia or malignancy.       Endocervix:           Nabothian cysts.           Negative for hyperplasia, atypia or malignancy.       Endometrium:           Benign endometrial polyp. Benign inactive endometrium.           Negative for hyperplasia, atypia or malignancy.       Myometrium:           Leiomyomata.            Negative for malignancy.       Serosa:           Unremarkable.           Negative for malignancy.       Bilateral fallopian tubes:           Benign fimbriated fallopian tubes.           Negative for malignancy.       Bilateral ovaries:           Unremarkable.           Negative for malignancy.   Medications: She has a current medication list which includes the following prescription(s): acetaminophen , calcipotriene, calcium-magnesium-zinc, vitamin d-3, diclofenac sodium, esomeprazole, fluvastatin, multiple vitamins-minerals, onetouch delica lancets 33g, synjardy, telmisartan, mounjaro, turmeric, and cyanocobalamin.   Allergies: Patient is allergic to celebrex [celecoxib], crestor [rosuvastatin], lipitor [atorvastatin], semaglutide, septra [sulfamethoxazole-trimethoprim], and zocor [simvastatin].   Physical Exam: BP 120/74   Pulse 93   Abdomen: soft, non-tender, without masses or organomegaly Suprapubic Incisions: healing well with some residual dermabond Pelvic Examination: Vagina: Incisions healing well, no dehiscence, no significant erythema. Sutures are present at incision line and there is not granulation tissue. No tenderness along the anterior or posterior vagina. No apical tenderness. No pelvic masses. No visible or palpable mesh.  POP-Q: POP-Q  -3  Aa   -3                                           Ba  -7                                              C   2                                            Gh  5                                            Pb  7                                            tvl   -3                                            Ap  -3                                            Bp                                                 D    ---------------------------------------------------------  Assessment and Plan:  1. Type 2 diabetes mellitus without complication, without long-term current use of  insulin  [facility])     - Pathology results were reviewed with the patient today and she verbalized understanding that the results were benign.  - Can resume regular activity including exercise and intercourse,  if desired.  - Discussed avoidance of heavy lifting and straining long term to reduce the risk of recurrence.   All questions answered.   Return in about 6 weeks (around 01/28/2024) for postop visit.

## 2024-01-28 ENCOUNTER — Ambulatory Visit: Admitting: Obstetrics

## 2024-01-28 ENCOUNTER — Ambulatory Visit

## 2024-01-28 VITALS — BP 124/86 | HR 112

## 2024-01-28 DIAGNOSIS — N952 Postmenopausal atrophic vaginitis: Secondary | ICD-10-CM | POA: Insufficient documentation

## 2024-01-28 DIAGNOSIS — Z48816 Encounter for surgical aftercare following surgery on the genitourinary system: Secondary | ICD-10-CM

## 2024-01-28 NOTE — Progress Notes (Signed)
 [facility]  Date of Visit: 01/28/2024  History of Present Illness: Ms. Winterhalter is a 68 y.o. female scheduled today for a 3 month post-operative visit.   Surgery: s/p Exam under anesthesia, transvaginal hysterectomy, bilateral salpingo-oophorectomy, uterosacral ligament suspension, anterior/posterior repair, perineorrhaphy, midurethral sling, cystourethroscopy on 11/06/23  She passed her postoperative void trial on POD#1.   Postoperative course was uncomplicated.   She has had a cold for 1 week with postnasal drainage.  Reports 2 episodes of urinary urgency, denies leakage. Denies difficulty with bladder emptying.  Bowel movements without difficulty in the past 1.5 weeks with daily type V stool, prior to that used miralax  1x/week due to firmer stool Type III-IV with sensation of incomplete emptying. Denies blood in stool Denies headaches Last HbA1C 6.3  UTI in the last 6 weeks? No  Pain? No  She has returned to her normal activity (except for postop restrictions), denies pain with intercourse Daughter got married Saturday in [facility] Vaginal bulge? No  Stress incontinence: No  Urgency/frequency: No  Urge incontinence: No  Voiding dysfunction: No  Bowel issues: Yes , BM 1x/day with intermittent miralax  use 1x/week  Subjective Success: Do you usually have a bulge or something falling out that you can see or feel in the vaginal area? No  Retreatment Success: Any retreatment with surgery or pessary for any compartment? No   Pathology results:  A. UTERUS INCLUDING CERVIX, BILATERAL FALLOPIAN TUBES AND OVARIES, TOTAL HYSTERECTOMY AND BILATERAL SALPINGO-OOPHORECTOMY: Cervix:           Unremarkable.           Negative for dysplasia or malignancy.       Endocervix:           Nabothian cysts.           Negative for hyperplasia, atypia or malignancy.       Endometrium:           Benign endometrial polyp. Benign inactive endometrium.           Negative for  hyperplasia, atypia or malignancy.       Myometrium:           Leiomyomata.           Negative for malignancy.       Serosa:           Unremarkable.           Negative for malignancy.       Bilateral fallopian tubes:           Benign fimbriated fallopian tubes.           Negative for malignancy.       Bilateral ovaries:           Unremarkable.           Negative for malignancy.   Medications: She has a current medication list which includes the following prescription(s): acetaminophen , calcipotriene, calcium-magnesium-zinc, vitamin d-3, diclofenac sodium, esomeprazole, fluvastatin, multiple vitamins-minerals, onetouch delica lancets 33g, synjardy, telmisartan, mounjaro, turmeric, and cyanocobalamin.   Allergies: Patient is allergic to celebrex [celecoxib], crestor [rosuvastatin], lipitor [atorvastatin], semaglutide, septra [sulfamethoxazole-trimethoprim], and zocor [simvastatin].   Physical Exam: BP 124/86   Pulse (!) 112   Abdomen: soft, non-tender, without masses or organomegaly Suprapubic Incisions: healing well with some residual dermabond Pelvic Examination: Vagina: Incisions healing well, no dehiscence, no significant erythema. Sutures are present at incision line and there is not granulation tissue. No tenderness along the anterior or posterior vagina. No apical  tenderness. No pelvic masses. No visible or palpable mesh.  POP-Q: POP-Q  -2                                            Aa   -2                                           Ba  -8                                              C   2                                            Gh  4                                            Pb  8                                            tvl   -3                                            Ap  -3                                            Bp                                                 D    ---------------------------------------------------------  Assessment and  Plan:  1. Vaginal atrophy    Vaginal atrophy Assessment & Plan: - uses lubrication for dryness during intercourse - can resume low dose vaginal estrogen    - encouraged titration of fiber supplementation to optimize stool consistency and reduce need for PRN miralax  use. - Pathology results were reviewed with benign results.  - continue regular activity including exercise and intercourse, return if change in symptoms  - Discussed avoidance of heavy lifting and straining long term to reduce the risk of recurrence, however encouraged active lifestyle   All questions answered.   No follow-ups on file.

## 2024-01-28 NOTE — Assessment & Plan Note (Signed)
-   uses lubrication for dryness during intercourse - can resume low dose vaginal estrogen

## 2024-01-28 NOTE — Patient Instructions (Signed)
Women should try to eat at least 21 to 25 grams of fiber a day, while men should aim for 30 to 38 grams a day. You can add fiber to your diet with food or a fiber supplement such as psyllium (metamucil), benefiber, or fibercon.   Here's a look at how much dietary fiber is found in some common foods. When buying packaged foods, check the Nutrition Facts label for fiber content. It can vary among brands.  Fruits Serving size Total fiber (grams)*  Raspberries 1 cup 8.0  Pear 1 medium 5.5  Apple, with skin 1 medium 4.5  Banana 1 medium 3.0  Orange 1 medium 3.0  Strawberries 1 cup 3.0   Vegetables Serving size Total fiber (grams)*  Green peas, boiled 1 cup 9.0  Broccoli, boiled 1 cup chopped 5.0  Turnip greens, boiled 1 cup 5.0  Brussels sprouts, boiled 1 cup 4.0  Potato, with skin, baked 1 medium 4.0  Sweet corn, boiled 1 cup 3.5  Cauliflower, raw 1 cup chopped 2.0  Carrot, raw 1 medium 1.5   Grains Serving size Total fiber (grams)*  Spaghetti, whole-wheat, cooked 1 cup 6.0  Barley, pearled, cooked 1 cup 6.0  Bran flakes 3/4 cup 5.5  Quinoa, cooked 1 cup 5.0  Oat bran muffin 1 medium 5.0  Oatmeal, instant, cooked 1 cup 5.0  Popcorn, air-popped 3 cups 3.5  Brown rice, cooked 1 cup 3.5  Bread, whole-wheat 1 slice 2.0  Bread, rye 1 slice 2.0   Legumes, nuts and seeds Serving size Total fiber (grams)*  Split peas, boiled 1 cup 16.0  Lentils, boiled 1 cup 15.5  Black beans, boiled 1 cup 15.0  Baked beans, canned 1 cup 10.0  Chia seeds 1 ounce 10.0  Almonds 1 ounce (23 nuts) 3.5  Pistachios 1 ounce (49 nuts) 3.0  Sunflower kernels 1 ounce 3.0  *Rounded to nearest 0.5 gram. Source: [employer] for [employer]	[place]

## 2024-02-05 ENCOUNTER — Ambulatory Visit
Admission: RE | Admit: 2024-02-05 | Discharge: 2024-02-05 | Disposition: A | Discharge status: Home / Self Care | Source: Ambulatory Visit | Attending: Endocrinology | Admitting: Endocrinology

## 2024-02-05 DIAGNOSIS — Z1231 Encounter for screening mammogram for malignant neoplasm of breast: Secondary | ICD-10-CM
